# Patient Record
Sex: Male | Born: 1955 | Race: White | Hispanic: No | Marital: Married | Smoking: Never smoker
Health system: Southern US, Community
[De-identification: ages and names within clinical notes are randomized; demographics above are authoritative.]

## PROBLEM LIST (undated history)

## (undated) DIAGNOSIS — R972 Elevated prostate specific antigen [PSA]: Secondary | ICD-10-CM

## (undated) DIAGNOSIS — H269 Unspecified cataract: Secondary | ICD-10-CM

## (undated) DIAGNOSIS — C801 Malignant (primary) neoplasm, unspecified: Secondary | ICD-10-CM

## (undated) HISTORY — PX: COLONOSCOPY: SHX174

## (undated) HISTORY — PX: PROSTATE BIOPSY: SHX241

---

## 2006-07-13 ENCOUNTER — Ambulatory Visit (HOSPITAL_COMMUNITY): Admission: RE | Admit: 2006-07-13 | Discharge: 2006-07-13 | Payer: Self-pay | Admitting: *Deleted

## 2007-04-30 IMAGING — CR DG CHEST 2V
2 series · 2 of 2 positions shown · non-contrast
Comparison: none

HISTORY: Precatheterization, dyspnea, chest pressure

CHEST 2 VIEWS:
No prior exams for comparison.
Normal heart size, mediastinal contours, and vascularity.
Minimal bronchitic changes
No infiltrate, effusion, or pneumothorax.
Bones unremarkable.

[view not recorded (1 of 2)]
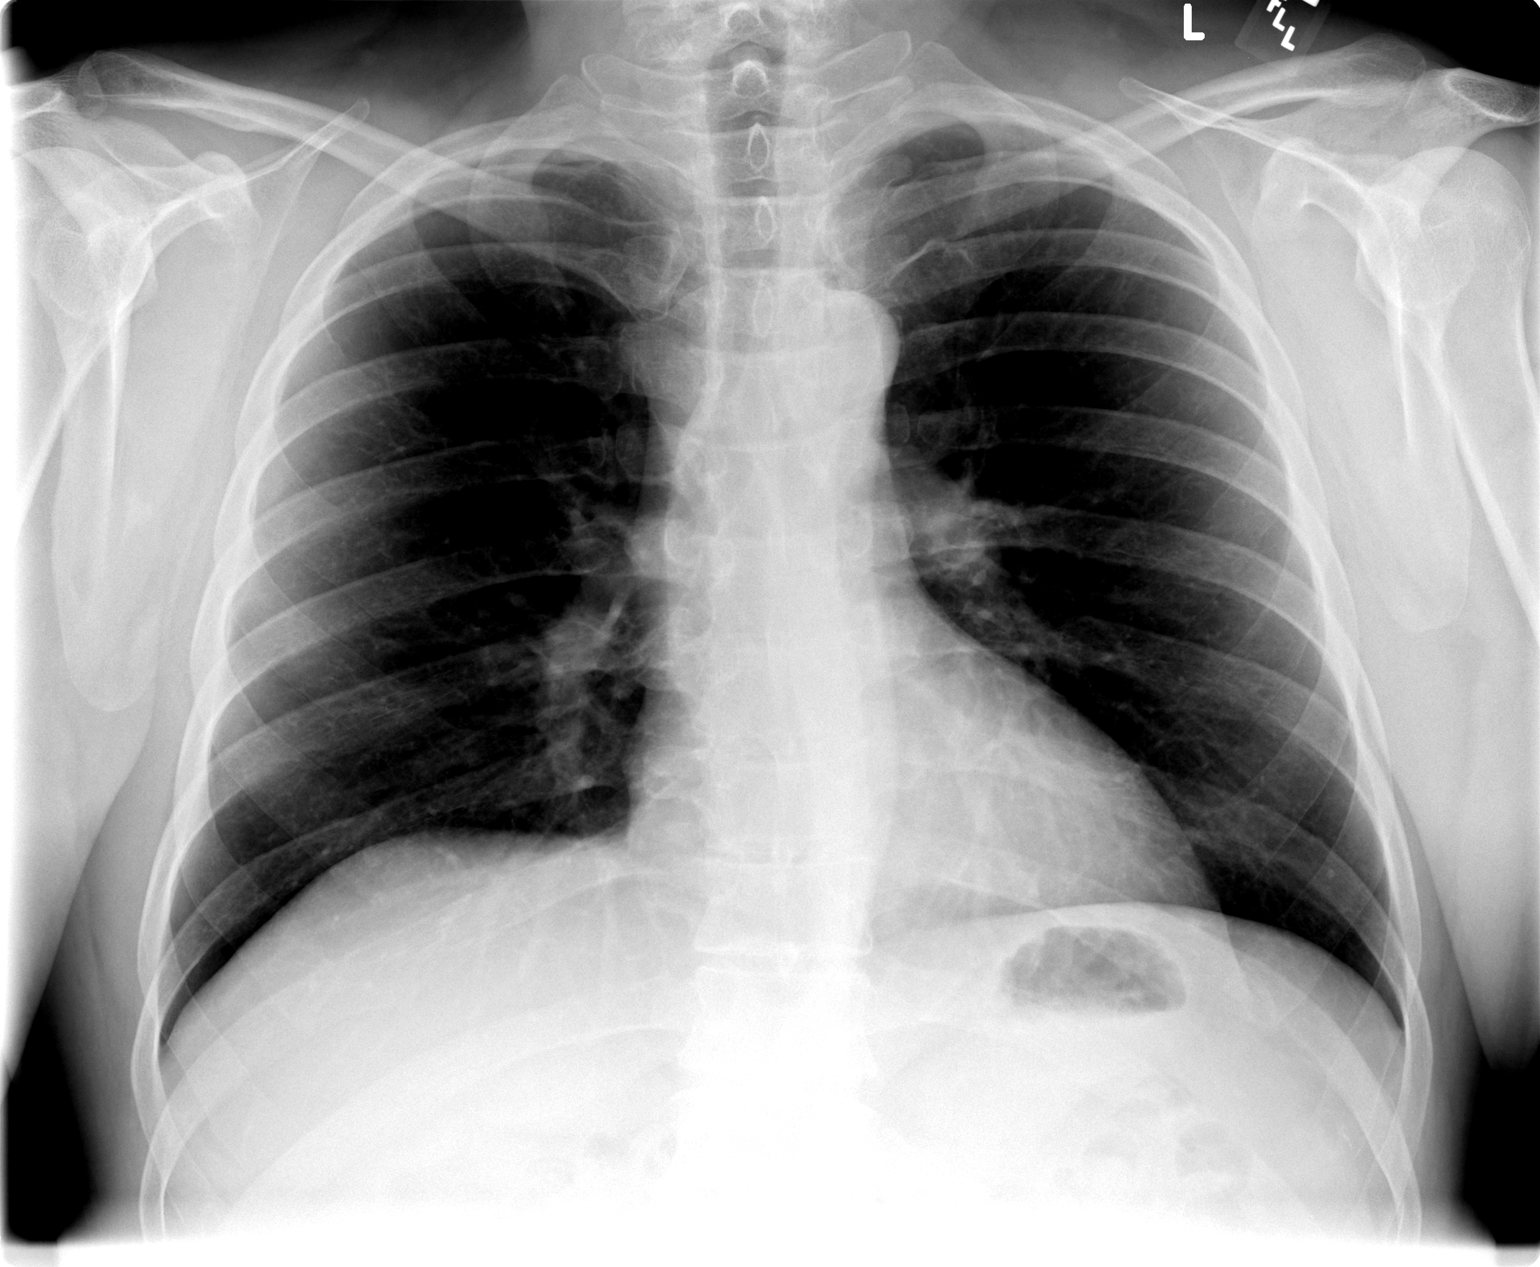

[view not recorded (2 of 2)]
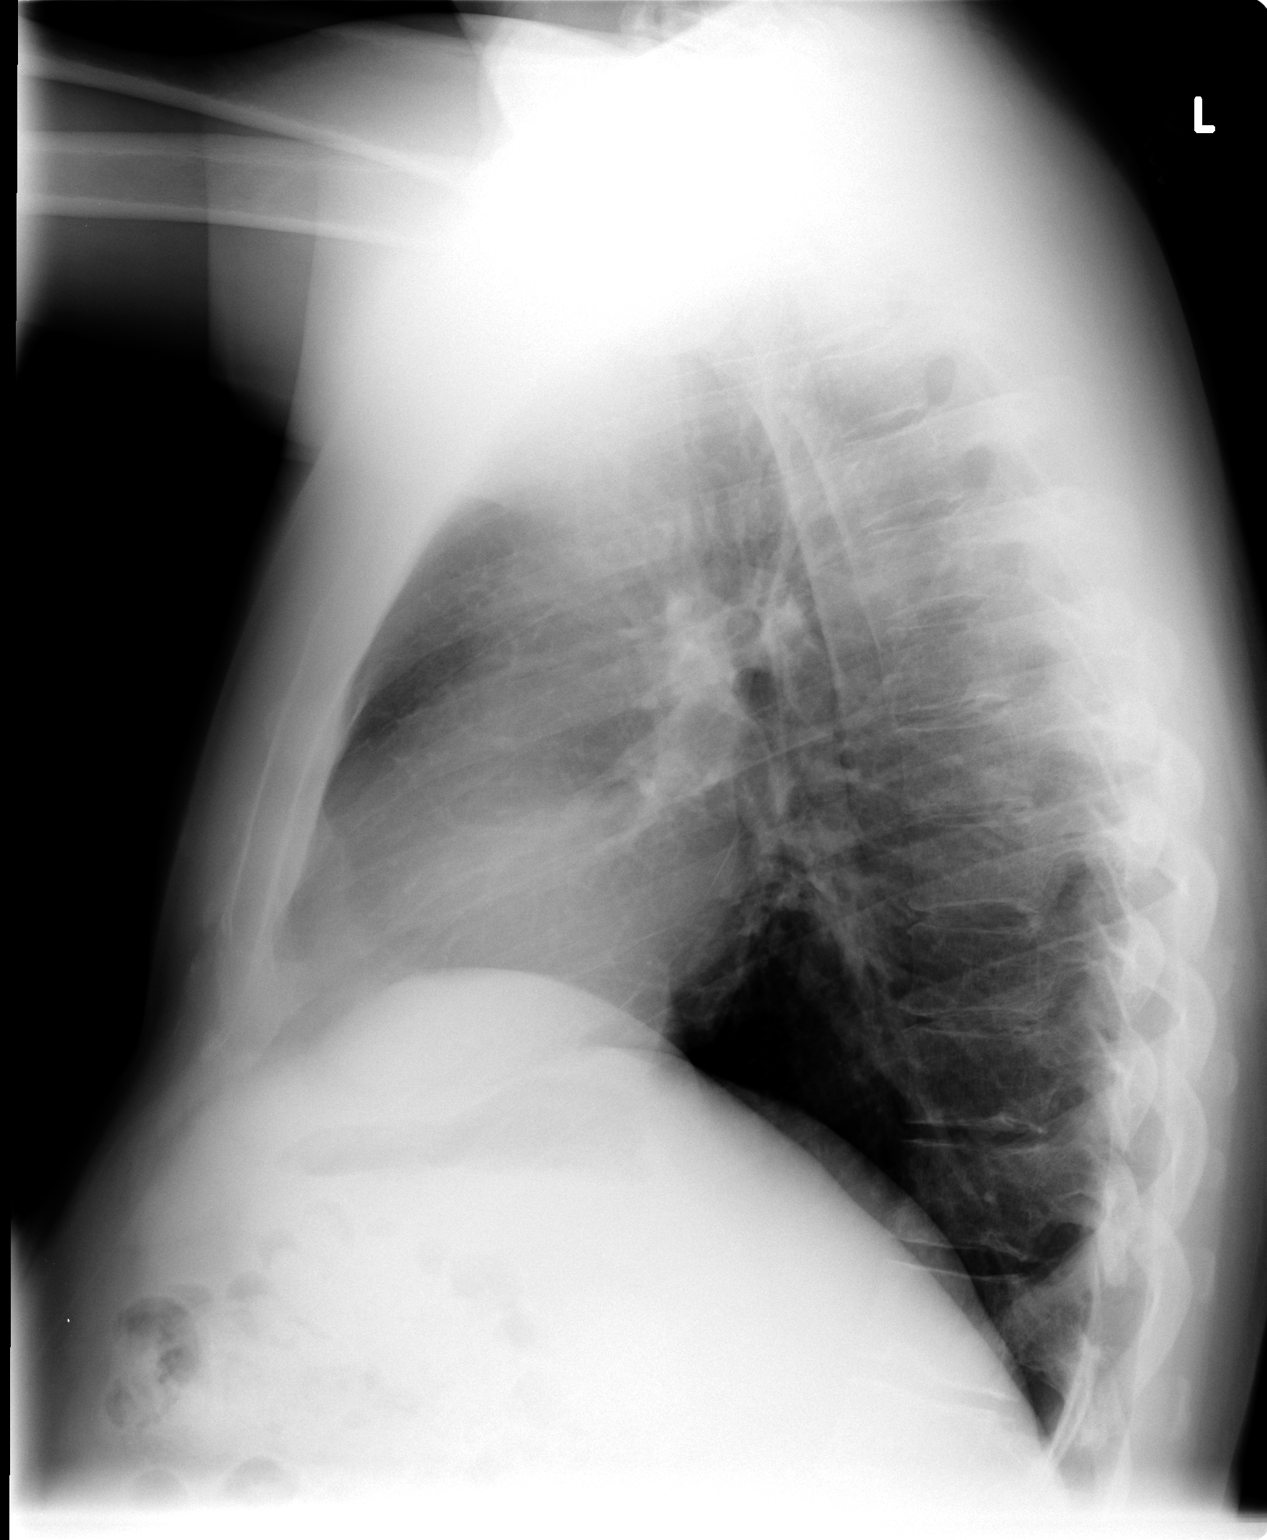

[2 of 2 positions shown; findings below may reference images not displayed]

IMPRESSION: Minimal bronchitic changes

## 2010-03-27 ENCOUNTER — Emergency Department (HOSPITAL_COMMUNITY): Admission: EM | Admit: 2010-03-27 | Discharge: 2010-03-27 | Payer: Self-pay | Admitting: Emergency Medicine

## 2016-01-28 DIAGNOSIS — Z6830 Body mass index (BMI) 30.0-30.9, adult: Secondary | ICD-10-CM | POA: Diagnosis not present

## 2016-01-28 DIAGNOSIS — Z1389 Encounter for screening for other disorder: Secondary | ICD-10-CM | POA: Diagnosis not present

## 2016-01-28 DIAGNOSIS — M7041 Prepatellar bursitis, right knee: Secondary | ICD-10-CM | POA: Diagnosis not present

## 2016-01-28 DIAGNOSIS — E6609 Other obesity due to excess calories: Secondary | ICD-10-CM | POA: Diagnosis not present

## 2016-04-21 DIAGNOSIS — E782 Mixed hyperlipidemia: Secondary | ICD-10-CM | POA: Diagnosis not present

## 2016-04-21 DIAGNOSIS — H109 Unspecified conjunctivitis: Secondary | ICD-10-CM | POA: Diagnosis not present

## 2016-04-21 DIAGNOSIS — I10 Essential (primary) hypertension: Secondary | ICD-10-CM | POA: Diagnosis not present

## 2016-04-21 DIAGNOSIS — Z681 Body mass index (BMI) 19 or less, adult: Secondary | ICD-10-CM | POA: Diagnosis not present

## 2016-05-18 ENCOUNTER — Ambulatory Visit (INDEPENDENT_AMBULATORY_CARE_PROVIDER_SITE_OTHER): Payer: BLUE CROSS/BLUE SHIELD

## 2016-05-18 ENCOUNTER — Encounter: Payer: Self-pay | Admitting: Orthopedic Surgery

## 2016-05-18 ENCOUNTER — Ambulatory Visit (INDEPENDENT_AMBULATORY_CARE_PROVIDER_SITE_OTHER): Payer: BLUE CROSS/BLUE SHIELD | Admitting: Orthopedic Surgery

## 2016-05-18 VITALS — BP 153/96 | HR 54 | Wt 205.0 lb

## 2016-05-18 DIAGNOSIS — M25561 Pain in right knee: Secondary | ICD-10-CM | POA: Diagnosis not present

## 2016-05-18 DIAGNOSIS — M1711 Unilateral primary osteoarthritis, right knee: Secondary | ICD-10-CM | POA: Diagnosis not present

## 2016-05-18 NOTE — Progress Notes (Signed)
Patient ID: Jose Hoover, male   DOB: 11/23/1955, 60 y.o.   MRN: [id number]  Chief Complaint  Patient presents with  . Knee Pain    right knee pain and swelling    HPI Jose OkWilliam P Hoover is a 60 y.o. male.  Presents for evaluation of his right knee. Primary problem is pain going up the steps. He did have a brief episode of pain where he had trouble walking. This was associated with swelling and difficulty bending his knee. At that time the pain was severe but is now improving. He has a history of prepatellar bursitis with 2 aspirations. That doesn't seem to be bothering him at this time  Review of Systems Review of Systems  Constitutional: Negative.   Respiratory: Negative.   Cardiovascular: Negative.    No prior history of surgery reported  History of hypertension  History of family history diabetes    Social History Social History  Substance Use Topics  . Smoking status: Current Every Day Smoker  . Smokeless tobacco: Never Used  . Alcohol use Not on file    No Known Allergies  No outpatient prescriptions have been marked as taking for the 05/18/16 encounter (Office Visit) with Vickki HearingStanley E Harrison, MD.      Physical Exam Physical Exam BP (!) 153/96   Pulse (!) 54   Wt 205 lb (93 kg)   Gen. appearance. The patient is well-developed and well-nourished, grooming and hygiene are normal. There are no gross congenital abnormalities  The patient is alert and oriented to person place and time  Mood and affect are normal  AmbulatioIs normal  Examination reveals the following: On inspection we finnormal range of motion alignment stability strength skin left knee  Right knee mild tenderness medial joint line  Full range of motion  Mild swollen prepatellar bursa sac  Ligaments stable Strength tests revealed grade 5 motor strength  Skin we find no rash ulceration or erythema  Sensation remains intact  Impression vascular system shows no peripheral  edema  Data Reviewed X-rays show mild arthritis lateral compartment more moderate arthritis medial compartment small joint effusion   Assessment    Encounter Diagnoses  Name Primary?  . Acute pain of right knee Yes  . Arthritis of knee, right        Planplan injected knee if symptoms return       Fuller CanadaStanley Harrison 05/18/2016, 2:42 PM

## 2017-08-02 HISTORY — PX: CATARACT EXTRACTION: SUR2

## 2017-10-31 DIAGNOSIS — R7309 Other abnormal glucose: Secondary | ICD-10-CM | POA: Diagnosis not present

## 2017-10-31 DIAGNOSIS — E663 Overweight: Secondary | ICD-10-CM | POA: Diagnosis not present

## 2017-10-31 DIAGNOSIS — Z1389 Encounter for screening for other disorder: Secondary | ICD-10-CM | POA: Diagnosis not present

## 2017-10-31 DIAGNOSIS — Z6829 Body mass index (BMI) 29.0-29.9, adult: Secondary | ICD-10-CM | POA: Diagnosis not present

## 2017-10-31 DIAGNOSIS — N529 Male erectile dysfunction, unspecified: Secondary | ICD-10-CM | POA: Diagnosis not present

## 2017-10-31 DIAGNOSIS — R7989 Other specified abnormal findings of blood chemistry: Secondary | ICD-10-CM | POA: Diagnosis not present

## 2017-10-31 DIAGNOSIS — N400 Enlarged prostate without lower urinary tract symptoms: Secondary | ICD-10-CM | POA: Diagnosis not present

## 2017-10-31 DIAGNOSIS — I10 Essential (primary) hypertension: Secondary | ICD-10-CM | POA: Diagnosis not present

## 2017-10-31 DIAGNOSIS — E782 Mixed hyperlipidemia: Secondary | ICD-10-CM | POA: Diagnosis not present

## 2018-01-02 DIAGNOSIS — N401 Enlarged prostate with lower urinary tract symptoms: Secondary | ICD-10-CM | POA: Diagnosis not present

## 2018-01-02 DIAGNOSIS — N138 Other obstructive and reflux uropathy: Secondary | ICD-10-CM | POA: Diagnosis not present

## 2018-01-02 DIAGNOSIS — R972 Elevated prostate specific antigen [PSA]: Secondary | ICD-10-CM | POA: Diagnosis not present

## 2018-01-02 DIAGNOSIS — N529 Male erectile dysfunction, unspecified: Secondary | ICD-10-CM | POA: Diagnosis not present

## 2018-01-23 DIAGNOSIS — R972 Elevated prostate specific antigen [PSA]: Secondary | ICD-10-CM | POA: Diagnosis not present

## 2018-03-08 DIAGNOSIS — H52203 Unspecified astigmatism, bilateral: Secondary | ICD-10-CM | POA: Diagnosis not present

## 2018-03-08 DIAGNOSIS — H25043 Posterior subcapsular polar age-related cataract, bilateral: Secondary | ICD-10-CM | POA: Diagnosis not present

## 2018-03-08 DIAGNOSIS — H524 Presbyopia: Secondary | ICD-10-CM | POA: Diagnosis not present

## 2018-03-08 DIAGNOSIS — H2513 Age-related nuclear cataract, bilateral: Secondary | ICD-10-CM | POA: Diagnosis not present

## 2018-03-08 DIAGNOSIS — H5203 Hypermetropia, bilateral: Secondary | ICD-10-CM | POA: Diagnosis not present

## 2018-03-22 DIAGNOSIS — H2513 Age-related nuclear cataract, bilateral: Secondary | ICD-10-CM | POA: Diagnosis not present

## 2018-03-22 DIAGNOSIS — H25043 Posterior subcapsular polar age-related cataract, bilateral: Secondary | ICD-10-CM | POA: Diagnosis not present

## 2018-03-29 DIAGNOSIS — H25042 Posterior subcapsular polar age-related cataract, left eye: Secondary | ICD-10-CM | POA: Diagnosis not present

## 2018-03-29 DIAGNOSIS — H25041 Posterior subcapsular polar age-related cataract, right eye: Secondary | ICD-10-CM | POA: Diagnosis not present

## 2018-03-29 DIAGNOSIS — H2512 Age-related nuclear cataract, left eye: Secondary | ICD-10-CM | POA: Diagnosis not present

## 2018-03-29 DIAGNOSIS — H2511 Age-related nuclear cataract, right eye: Secondary | ICD-10-CM | POA: Diagnosis not present

## 2018-04-05 DIAGNOSIS — H25042 Posterior subcapsular polar age-related cataract, left eye: Secondary | ICD-10-CM | POA: Diagnosis not present

## 2018-04-05 DIAGNOSIS — H2512 Age-related nuclear cataract, left eye: Secondary | ICD-10-CM | POA: Diagnosis not present

## 2022-09-22 ENCOUNTER — Other Ambulatory Visit: Payer: Self-pay

## 2022-09-22 ENCOUNTER — Encounter (HOSPITAL_COMMUNITY): Payer: Self-pay | Admitting: *Deleted

## 2022-09-22 ENCOUNTER — Emergency Department (HOSPITAL_COMMUNITY): Payer: BC Managed Care – PPO

## 2022-09-22 ENCOUNTER — Emergency Department (HOSPITAL_COMMUNITY)
Admission: EM | Admit: 2022-09-22 | Discharge: 2022-09-22 | Disposition: A | Discharge status: Home / Self Care | Payer: BC Managed Care – PPO | Attending: Emergency Medicine | Admitting: Emergency Medicine

## 2022-09-22 DIAGNOSIS — R42 Dizziness and giddiness: Secondary | ICD-10-CM | POA: Diagnosis not present

## 2022-09-22 DIAGNOSIS — I10 Essential (primary) hypertension: Secondary | ICD-10-CM | POA: Diagnosis not present

## 2022-09-22 DIAGNOSIS — R29818 Other symptoms and signs involving the nervous system: Secondary | ICD-10-CM | POA: Diagnosis not present

## 2022-09-22 DIAGNOSIS — I951 Orthostatic hypotension: Secondary | ICD-10-CM | POA: Diagnosis not present

## 2022-09-22 LAB — PROTIME-INR
INR: 1 (ref 0.8–1.2)
Prothrombin Time: 12.9 seconds (ref 11.4–15.2)

## 2022-09-22 LAB — CBC
HCT: 43 % (ref 39.0–52.0)
Hemoglobin: 13.5 g/dL (ref 13.0–17.0)
MCH: 27.2 pg (ref 26.0–34.0)
MCHC: 31.4 g/dL (ref 30.0–36.0)
MCV: 86.7 fL (ref 80.0–100.0)
Platelets: 172 10*3/uL (ref 150–400)
RBC: 4.96 MIL/uL (ref 4.22–5.81)
RDW: 13.3 % (ref 11.5–15.5)
WBC: 7.1 10*3/uL (ref 4.0–10.5)
nRBC: 0 % (ref 0.0–0.2)

## 2022-09-22 LAB — COMPREHENSIVE METABOLIC PANEL
ALT: 28 U/L (ref 0–44)
AST: 21 U/L (ref 15–41)
Albumin: 4.3 g/dL (ref 3.5–5.0)
Alkaline Phosphatase: 50 U/L (ref 38–126)
Anion gap: 8 (ref 5–15)
BUN: 20 mg/dL (ref 8–23)
CO2: 26 mmol/L (ref 22–32)
Calcium: 9 mg/dL (ref 8.9–10.3)
Chloride: 100 mmol/L (ref 98–111)
Creatinine, Ser: 0.83 mg/dL (ref 0.61–1.24)
GFR, Estimated: >60 mL/min (ref >60)
Glucose, Bld: 123 mg/dL — ABNORMAL HIGH (ref 70–99)
Potassium: 4.4 mmol/L (ref 3.5–5.1)
Sodium: 134 mmol/L — ABNORMAL LOW (ref 135–145)
Total Bilirubin: 0.7 mg/dL (ref 0.3–1.2)
Total Protein: 7.8 g/dL (ref 6.5–8.1)

## 2022-09-22 LAB — DIFFERENTIAL
Abs Immature Granulocytes: 0.04 10*3/uL (ref 0.00–0.07)
Basophils Absolute: 0 10*3/uL (ref 0.0–0.1)
Basophils Relative: 0 %
Eosinophils Absolute: 0.2 10*3/uL (ref 0.0–0.5)
Eosinophils Relative: 2 %
Immature Granulocytes: 1 %
Lymphocytes Relative: 10 %
Lymphs Abs: 0.7 10*3/uL (ref 0.7–4.0)
Monocytes Absolute: 0.5 10*3/uL (ref 0.1–1.0)
Monocytes Relative: 8 %
Neutro Abs: 5.6 10*3/uL (ref 1.7–7.7)
Neutrophils Relative %: 79 %

## 2022-09-22 LAB — URINALYSIS, ROUTINE W REFLEX MICROSCOPIC
Bacteria, UA: NONE SEEN
Bilirubin Urine: NEGATIVE
Glucose, UA: NEGATIVE mg/dL
Hgb urine dipstick: NEGATIVE
Ketones, ur: 5 mg/dL — AB
Leukocytes,Ua: NEGATIVE
Nitrite: NEGATIVE
Protein, ur: 30 mg/dL — AB
Specific Gravity, Urine: 1.024 (ref 1.005–1.030)
pH: 6 (ref 5.0–8.0)

## 2022-09-22 LAB — RAPID URINE DRUG SCREEN, HOSP PERFORMED
Amphetamines: NOT DETECTED
Barbiturates: NOT DETECTED
Benzodiazepines: NOT DETECTED
Cocaine: NOT DETECTED
Opiates: NOT DETECTED
Tetrahydrocannabinol: NOT DETECTED

## 2022-09-22 LAB — ETHANOL: Alcohol, Ethyl (B): <10 mg/dL (ref <10)

## 2022-09-22 LAB — APTT: aPTT: 24 seconds (ref 24–36)

## 2022-09-22 LAB — MAGNESIUM: Magnesium: 2 mg/dL (ref 1.7–2.4)

## 2022-09-22 NOTE — Discharge Instructions (Signed)
Your lab work and MRI are reassuring.  Continue to check your blood pressures at home.  If BPs remain elevated, talk to your primary care doctor about possibly starting on a medication for hypertension.  Return to the emergency department at anytime for any new or worsening symptoms of concern.

## 2022-09-22 NOTE — ED Triage Notes (Addendum)
Noted with elevated BP this afternoon.  Denies any pain. Denies any hx of HTN.  Pt checked his BP at home due feeling off balance at 1430 today.  Vision disturbance, states feels like he has thick glasses on and takes "his eyes a minute to catch up".  EDP in triage to assess pt.

## 2022-09-22 NOTE — ED Provider Notes (Signed)
Jose Hoover   CSN: [id number] Arrival date & time: 09/22/22  1806     History  Chief Complaint  Patient presents with   Hypertension   Dizziness    Jose Hoover is a 67 y.o. male.   Hypertension  Dizziness Patient presents for dizziness and visual disturbance.  Earlier today, he was in his normal state of health.  In the early afternoon, approximately 2 hours prior to arrival, he noticed an odd feeling with his vision.  He denies any loss of visual acuity.  He states that when he looks from side-to-side, it feels like his eyes take some time to catch up.  He also describes some mild dizziness.  He denies any areas of weakness or numbness.     Home Medications Prior to Admission medications   Not on File      Allergies    Patient has no known allergies.    Review of Systems   Review of Systems  Eyes:  Positive for visual disturbance.  Neurological:  Positive for dizziness.  All other systems reviewed and are negative.   Physical Exam Updated Vital Signs BP (!) 162/90   Pulse (!) 56   Temp 97.8 F (36.6 C)   Resp 15   Ht 5' 10"$  (1.778 m)   Wt 86.2 kg   SpO2 98%   BMI 27.26 kg/m  Physical Exam Vitals and nursing Hoover reviewed.  Constitutional:      General: He is not in acute distress.    Appearance: Normal appearance. He is well-developed. He is not ill-appearing, toxic-appearing or diaphoretic.  HENT:     Head: Normocephalic and atraumatic.     Right Ear: External ear normal.     Left Ear: External ear normal.     Nose: Nose normal.     Mouth/Throat:     Mouth: Mucous membranes are moist.  Eyes:     Extraocular Movements: Extraocular movements intact.     Conjunctiva/sclera: Conjunctivae normal.     Comments: Nystagmus is present  Cardiovascular:     Rate and Rhythm: Normal rate and regular rhythm.  Pulmonary:     Effort: Pulmonary effort is normal. No respiratory distress.   Abdominal:     General: There is no distension.     Palpations: Abdomen is soft.  Musculoskeletal:        General: No swelling. Normal range of motion.     Cervical back: Normal range of motion and neck supple.     Right lower leg: No edema.     Left lower leg: No edema.  Skin:    General: Skin is warm and dry.     Capillary Refill: Capillary refill takes less than 2 seconds.     Coloration: Skin is not jaundiced or pale.  Neurological:     Mental Status: He is alert.     Cranial Nerves: Cranial nerves 2-12 are intact. No dysarthria or facial asymmetry.     Sensory: Sensation is intact. No sensory deficit.     Motor: Motor function is intact. No weakness, abnormal muscle tone or pronator drift.     Coordination: Coordination is intact. Finger-Nose-Finger Test normal.  Psychiatric:        Mood and Affect: Mood normal.     ED Results / Procedures / Treatments   Labs (all labs ordered are listed, but only abnormal results are displayed) Labs Reviewed  COMPREHENSIVE METABOLIC PANEL - Abnormal; Notable  for the following components:      Result Value   Sodium 134 (*)    Glucose, Bld 123 (*)    All other components within normal limits  URINALYSIS, ROUTINE W REFLEX MICROSCOPIC - Abnormal; Notable for the following components:   Ketones, ur 5 (*)    Protein, ur 30 (*)    All other components within normal limits  PROTIME-INR  APTT  CBC  DIFFERENTIAL  ETHANOL  RAPID URINE DRUG SCREEN, HOSP PERFORMED  MAGNESIUM  I-STAT CHEM 8, ED    EKG None  Radiology MR BRAIN WO CONTRAST  Result Date: 09/22/2022 CLINICAL DATA:  Neuro deficit, acute, stroke suspected EXAM: MRI HEAD WITHOUT CONTRAST TECHNIQUE: Multiplanar, multiecho pulse sequences of the brain and surrounding structures were obtained without intravenous contrast. COMPARISON:  None Available. FINDINGS: Brain: Diffusion imaging does not show any acute or subacute infarction. Brainstem and cerebellum are normal. Cerebral  hemispheres are normal except for a few punctate foci of T2 and FLAIR signal in the white matter of the forceps major. No evidence of widespread small-vessel disease. No cortical or large vessel territory insult. No mass lesion, hemorrhage, hydrocephalus or extra-axial collection. Vascular: Major vessels at the base of the brain show flow. Skull and upper cervical spine: Negative Sinuses/Orbits: Clear/normal Other: None IMPRESSION: No acute or reversible finding. Normal study with exception of a few punctate foci of T2 and FLAIR signal in the white matter of the forceps major, often seen at 68. Electronically Signed   By: Nelson Chimes M.D.   On: 09/22/2022 19:51    Procedures Procedures    Medications Ordered in ED Medications - No data to display  ED Course/ Medical Decision Making/ A&P                             Medical Decision Making Amount and/or Complexity of Data Reviewed Labs: ordered. Radiology: ordered.   This patient presents to the ED for concern of dizziness, this involves an extensive number of treatment options, and is a complaint that carries with it a high risk of complications and morbidity.  The differential diagnosis includes CVA, TIA, dehydration, metabolic derangements, polypharmacy, anxiety, hypertensive urgency   Co morbidities that complicate the patient evaluation  BPH, elevated PSA   Additional history obtained:  Additional history obtained from N/A External records from outside source obtained and reviewed including EMR   Lab Tests:  I Ordered, and personally interpreted labs.  The pertinent results include: Normal kidney function, normal electrolytes, normal glucose, normal hemoglobin, no leukocytosis   Imaging Studies ordered:  I ordered imaging studies including MRI brain I independently visualized and interpreted imaging which showed no acute findings I agree with the radiologist interpretation   Cardiac Monitoring: / EKG:  The  patient was maintained on a cardiac monitor.  I personally viewed and interpreted the cardiac monitored which showed an underlying rhythm of: Sinus rhythm  Problem List / ED Course / Critical interventions / Medication management  Patient presents for acute onset of dizziness and visual disturbances.  This occurred 2 hours prior to arrival.  On arrival to the ED, vital signs are notable for hypertension.  Patient states that this is not normal for him.  On physical exam, patient is alert and oriented.  He has no focal neurologic deficits but does have what appears to be some vertical nystagmus.  This raises concern for central etiology.  MRI was ordered.  Patient  underwent MRI and lab work.  MRI did not show any acute findings.  Lab work was unremarkable.  On reassessment, patient reports improved symptoms.  Blood pressure remains elevated.  Patient reports that he does not take any blood pressure medications.  He will occasionally check his blood pressure at home and it is typically in the normal range.  Elevation of blood pressure not consistent with hypertensive emergency.  Patient was advised to continue to check his blood pressure at home and follow-up with his outpatient doctors if pressure readings remain high.  Given his reassuring workup and improved symptoms, patient is stable for discharge.  Social Determinants of Health:  Has access to outpatient care         Final Clinical Impression(s) / ED Diagnoses Final diagnoses:  Dizziness    Rx / DC Orders ED Discharge Orders     None         Godfrey Pick, MD 09/22/22 2037

## 2023-01-06 DIAGNOSIS — E291 Testicular hypofunction: Secondary | ICD-10-CM | POA: Diagnosis not present

## 2023-01-06 DIAGNOSIS — R972 Elevated prostate specific antigen [PSA]: Secondary | ICD-10-CM | POA: Diagnosis not present

## 2023-01-06 DIAGNOSIS — Z1329 Encounter for screening for other suspected endocrine disorder: Secondary | ICD-10-CM | POA: Diagnosis not present

## 2023-01-14 DIAGNOSIS — R7309 Other abnormal glucose: Secondary | ICD-10-CM | POA: Diagnosis not present

## 2023-01-14 DIAGNOSIS — M25511 Pain in right shoulder: Secondary | ICD-10-CM | POA: Diagnosis not present

## 2023-01-14 DIAGNOSIS — Z6830 Body mass index (BMI) 30.0-30.9, adult: Secondary | ICD-10-CM | POA: Diagnosis not present

## 2023-01-14 DIAGNOSIS — Z1331 Encounter for screening for depression: Secondary | ICD-10-CM | POA: Diagnosis not present

## 2023-01-14 DIAGNOSIS — E6609 Other obesity due to excess calories: Secondary | ICD-10-CM | POA: Diagnosis not present

## 2023-01-14 DIAGNOSIS — Z0001 Encounter for general adult medical examination with abnormal findings: Secondary | ICD-10-CM | POA: Diagnosis not present

## 2023-01-19 DIAGNOSIS — E782 Mixed hyperlipidemia: Secondary | ICD-10-CM | POA: Diagnosis not present

## 2023-01-19 DIAGNOSIS — Z125 Encounter for screening for malignant neoplasm of prostate: Secondary | ICD-10-CM | POA: Diagnosis not present

## 2023-01-19 DIAGNOSIS — R7309 Other abnormal glucose: Secondary | ICD-10-CM | POA: Diagnosis not present

## 2023-01-19 DIAGNOSIS — Z0001 Encounter for general adult medical examination with abnormal findings: Secondary | ICD-10-CM | POA: Diagnosis not present

## 2023-02-23 ENCOUNTER — Ambulatory Visit (INDEPENDENT_AMBULATORY_CARE_PROVIDER_SITE_OTHER): Payer: BC Managed Care – PPO | Admitting: Urology

## 2023-02-23 ENCOUNTER — Encounter: Payer: Self-pay | Admitting: Urology

## 2023-02-23 VITALS — BP 182/79 | HR 58

## 2023-02-23 DIAGNOSIS — R972 Elevated prostate specific antigen [PSA]: Secondary | ICD-10-CM

## 2023-02-23 LAB — URINALYSIS, ROUTINE W REFLEX MICROSCOPIC
Bilirubin, UA: NEGATIVE
Glucose, UA: NEGATIVE
Ketones, UA: NEGATIVE
Leukocytes,UA: NEGATIVE
Nitrite, UA: NEGATIVE
Protein,UA: NEGATIVE
RBC, UA: NEGATIVE
Specific Gravity, UA: <1.005 — ABNORMAL LOW (ref 1.005–1.030)
Urobilinogen, Ur: 0.2 mg/dL (ref 0.2–1.0)
pH, UA: 5.5 (ref 5.0–7.5)

## 2023-02-23 NOTE — Progress Notes (Signed)
02/23/2023 10:06 AM   Chrissie Noa Hostler 1955-09-14 [id number]  Referring provider: Janace Aris, NP [address] [place],  [place] [address]  Elevated PSA   HPI: Jose Hoover is a 67yo here for evaluation of elevated PSA. PSA 9.8 this year. No prior PSAs. No family hx of prostate cancer. IPSS 5 QOl 2 on no therapy. He was on TRT 7 years ago. He was being considered recently for TRT therapy and on screening the PSA was found to be elevated.    PMH: No past medical history on file.  Surgical History: No past surgical history on file.  Home Medications:  Allergies as of 02/23/2023   No Known Allergies      Medication List    as of February 23, 2023 10:06 AM   You have not been prescribed any medications.     Allergies: No Known Allergies  Family History: No family history on file.  Social History:  reports that he has never smoked. He has never used smokeless tobacco. He reports current alcohol use. He reports that he does not use drugs.  ROS: All other review of systems were reviewed and are negative except what is noted above in HPI  Physical Exam: There were no vitals taken for this visit.  Constitutional:  Alert and oriented, No acute distress. HEENT: [place] AT, moist mucus membranes.  Trachea midline, no masses. Cardiovascular: No clubbing, cyanosis, or edema. Respiratory: Normal respiratory effort, no increased work of breathing. GI: Abdomen is soft, nontender, nondistended, no abdominal masses GU: No CVA tenderness. Circumcised phallus. No masses/lesions on penis, testis, scrotum. Prostate 40g smooth no nodules no induration.  Lymph: No cervical or inguinal lymphadenopathy. Skin: No rashes, bruises or suspicious lesions. Neurologic: Grossly intact, no focal deficits, moving all 4 extremities. Psychiatric: Normal mood and affect.  Laboratory Data: Lab Results  Component Value Date   WBC 7.1 09/22/2022   HGB 13.5 09/22/2022   HCT 43.0 09/22/2022    MCV 86.7 09/22/2022   PLT 172 09/22/2022    Lab Results  Component Value Date   CREATININE 0.83 09/22/2022    No results found for: "PSA"  No results found for: "TESTOSTERONE"  No results found for: "HGBA1C"  Urinalysis    Component Value Date/Time   COLORURINE YELLOW 09/22/2022 1913   APPEARANCEUR CLEAR 09/22/2022 1913   LABSPEC 1.024 09/22/2022 1913   PHURINE 6.0 09/22/2022 1913   GLUCOSEU NEGATIVE 09/22/2022 1913   HGBUR NEGATIVE 09/22/2022 1913   BILIRUBINUR NEGATIVE 09/22/2022 1913   KETONESUR 5 (A) 09/22/2022 1913   PROTEINUR 30 (A) 09/22/2022 1913   NITRITE NEGATIVE 09/22/2022 1913   LEUKOCYTESUR NEGATIVE 09/22/2022 1913    Lab Results  Component Value Date   BACTERIA NONE SEEN 09/22/2022    Pertinent Imaging:  No results found for this or any previous visit.  No results found for this or any previous visit.  No results found for this or any previous visit.  No results found for this or any previous visit.  No results found for this or any previous visit.  No valid procedures specified. No results found for this or any previous visit.  No results found for this or any previous visit.   Assessment & Plan:    1. Elevated PSA We will recheck PSA. If his PSA remains elevated we will proceed with prostate biopsy - Urinalysis, Routine w reflex microscopic   No follow-ups on file.  Wilkie Aye, MD  [facility] [place]

## 2023-02-23 NOTE — Patient Instructions (Signed)

## 2023-03-01 ENCOUNTER — Telehealth: Payer: Self-pay

## 2023-03-01 DIAGNOSIS — R972 Elevated prostate specific antigen [PSA]: Secondary | ICD-10-CM

## 2023-03-01 MED ORDER — LEVOFLOXACIN 750 MG PO TABS: 750.0000 mg | ORAL_TABLET | Freq: Once | ORAL | 0 refills | Status: AC

## 2023-03-01 NOTE — Telephone Encounter (Signed)
Patient called and made aware. Appointments made, orders placed, and biopsy instructions went over with patient via phone and sent via mail.

## 2023-03-01 NOTE — Telephone Encounter (Signed)
-----   Message from Wilkie Aye sent at 03/01/2023 12:39 PM EDT ----- PSA remains high., He should proceed with biopsy ----- Message ----- From: Gustavus Messing, LPN Sent: 7/56/4332   2:25 PM EDT To: Malen Gauze, MD  Please review

## 2023-03-01 NOTE — Progress Notes (Signed)
Biopsy instructions letter

## 2023-04-11 ENCOUNTER — Encounter: Payer: Self-pay | Admitting: Urology

## 2023-04-11 ENCOUNTER — Encounter (HOSPITAL_COMMUNITY): Payer: Self-pay

## 2023-04-11 ENCOUNTER — Ambulatory Visit (HOSPITAL_COMMUNITY)
Admission: RE | Admit: 2023-04-11 | Discharge: 2023-04-11 | Disposition: A | Discharge status: Home / Self Care | Payer: BC Managed Care – PPO | Source: Ambulatory Visit | Attending: Urology | Admitting: Urology

## 2023-04-11 ENCOUNTER — Ambulatory Visit (HOSPITAL_BASED_OUTPATIENT_CLINIC_OR_DEPARTMENT_OTHER): Payer: BC Managed Care – PPO | Admitting: Urology

## 2023-04-11 ENCOUNTER — Other Ambulatory Visit: Payer: Self-pay | Admitting: Urology

## 2023-04-11 VITALS — BP 178/95 | HR 60 | Temp 98.1°F | Resp 18 | Ht 70.0 in | Wt 195.0 lb

## 2023-04-11 DIAGNOSIS — C61 Malignant neoplasm of prostate: Secondary | ICD-10-CM

## 2023-04-11 DIAGNOSIS — R972 Elevated prostate specific antigen [PSA]: Secondary | ICD-10-CM | POA: Diagnosis not present

## 2023-04-11 HISTORY — DX: Unspecified cataract: H26.9

## 2023-04-11 MED ORDER — LIDOCAINE HCL (PF) 2 % IJ SOLN
10.0000 mL | Freq: Once | INTRAMUSCULAR | Status: AC
  Administered 2023-04-11: 10 mL

## 2023-04-11 MED ORDER — GENTAMICIN SULFATE 40 MG/ML IJ SOLN
INTRAMUSCULAR | Status: AC
  Filled 2023-04-11: qty 2

## 2023-04-11 MED ORDER — LIDOCAINE HCL (PF) 2 % IJ SOLN
INTRAMUSCULAR | Status: AC
  Filled 2023-04-11: qty 10

## 2023-04-11 MED ORDER — GENTAMICIN SULFATE 40 MG/ML IJ SOLN
80.0000 mg | Freq: Once | INTRAMUSCULAR | Status: AC
  Administered 2023-04-11: 80 mg via INTRAMUSCULAR

## 2023-04-11 NOTE — Progress Notes (Signed)
PT tolerated prostate biopsy procedure and antibiotic injection well today. Labs obtained and sent for pathology by Richard from ultrasound. PT ambulatory at discharge with no acute distress noted and verbalized understanding of discharge instructions. PT to follow up with urologist as scheduled on 04/29/23 @ 12:50.

## 2023-04-11 NOTE — Progress Notes (Signed)
Prostate Biopsy Procedure   Informed consent was obtained after discussing risks/benefits of the procedure.  A time out was performed to ensure correct patient identity.  Pre-Procedure: - Last PSA Level: No results found for: "PSA" - Gentamicin given prophylactically - Levaquin 500 mg administered PO -Transrectal Ultrasound performed revealing a 37.2 gm prostate -No significant hypoechoic or median lobe noted  Procedure: - Prostate block performed using 10 cc 1% lidocaine and biopsies taken from sextant areas, a total of 12 under ultrasound guidance.  Post-Procedure: - Patient tolerated the procedure well - He was counseled to seek immediate medical attention if experiences any severe pain, significant bleeding, or fevers - Return in one week to discuss biopsy results

## 2023-04-20 ENCOUNTER — Encounter: Payer: Self-pay | Admitting: Urology

## 2023-04-20 ENCOUNTER — Ambulatory Visit (INDEPENDENT_AMBULATORY_CARE_PROVIDER_SITE_OTHER): Payer: BC Managed Care – PPO | Admitting: Urology

## 2023-04-20 VITALS — BP 146/87 | HR 70

## 2023-04-20 DIAGNOSIS — C61 Malignant neoplasm of prostate: Secondary | ICD-10-CM | POA: Diagnosis not present

## 2023-04-20 NOTE — Patient Instructions (Signed)

## 2023-04-20 NOTE — Progress Notes (Signed)
04/20/2023 3:52 PM   Jose Hoover 07/22/1956 [id number]  Referring provider: Assunta Found, MD [address] [place],  [place] [address]  Followup prostate biopsy   HPI: [name] is a 67yo here for followup after prostate biopsy. Biopsy revealed Gleason 3+3=6 in 6/12 cores all on the right side. PSA 8.7. Prostate volume 37cc.    PMH: Past Medical History:  Diagnosis Date   Cataract     Surgical History: Past Surgical History:  Procedure Laterality Date   CATARACT EXTRACTION  2019    Home Medications:  Allergies as of 04/20/2023       Reactions   Bee Venom    Other Swelling   BEES: Hives, swelling,  Difficulty breathing        Medication List    as of April 20, 2023  3:52 PM   You have not been prescribed any medications.     Allergies:  Allergies  Allergen Reactions   Bee Venom    Other Swelling    BEES: Hives, swelling,  Difficulty breathing    Family History: No family history on file.  Social History:  reports that he has never smoked. He has never used smokeless tobacco. He reports current alcohol use. He reports that he does not use drugs.  ROS: All other review of systems were reviewed and are negative except what is noted above in HPI  Physical Exam: BP (!) 146/87   Pulse 70   Constitutional:  Alert and oriented, No acute distress. HEENT: [place] AT, moist mucus membranes.  Trachea midline, no masses. Cardiovascular: No clubbing, cyanosis, or edema. Respiratory: Normal respiratory effort, no increased work of breathing. GI: Abdomen is soft, nontender, nondistended, no abdominal masses GU: No CVA tenderness.  Lymph: No cervical or inguinal lymphadenopathy. Skin: No rashes, bruises or suspicious lesions. Neurologic: Grossly intact, no focal deficits, moving all 4 extremities. Psychiatric: Normal mood and affect.  Laboratory Data: Lab Results  Component Value Date   WBC 7.1 09/22/2022   HGB 13.5 09/22/2022   HCT 43.0  09/22/2022   MCV 86.7 09/22/2022   PLT 172 09/22/2022    Lab Results  Component Value Date   CREATININE 0.83 09/22/2022    No results found for: "PSA"  No results found for: "TESTOSTERONE"  No results found for: "HGBA1C"  Urinalysis    Component Value Date/Time   COLORURINE YELLOW 09/22/2022 1913   APPEARANCEUR Clear 02/23/2023 1022   LABSPEC 1.024 09/22/2022 1913   PHURINE 6.0 09/22/2022 1913   GLUCOSEU Negative 02/23/2023 1022   HGBUR NEGATIVE 09/22/2022 1913   BILIRUBINUR Negative 02/23/2023 1022   KETONESUR 5 (A) 09/22/2022 1913   PROTEINUR Negative 02/23/2023 1022   PROTEINUR 30 (A) 09/22/2022 1913   NITRITE Negative 02/23/2023 1022   NITRITE NEGATIVE 09/22/2022 1913   LEUKOCYTESUR Negative 02/23/2023 1022   LEUKOCYTESUR NEGATIVE 09/22/2022 1913    Lab Results  Component Value Date   LABMICR Comment 02/23/2023   BACTERIA NONE SEEN 09/22/2022    Pertinent Imaging:  No results found for this or any previous visit.  No results found for this or any previous visit.  No results found for this or any previous visit.  No results found for this or any previous visit.  No results found for this or any previous visit.  No valid procedures specified. No results found for this or any previous visit.  No results found for this or any previous visit.   Assessment & Plan:    1. Prostate  cancer [facility]) I discussed the natural history of low risk prostate cancer with the patient and the various treatment options including active surveillance, RALP, IMRT, brachytherapy, cryotherapy, HIFU and ADT. After discussing the options the patient is undecided. I will refer him to Radiation Oncology for discussion of radiation therapy and Dr. Berneice Heinrich for consideration of RALP    No follow-ups on file.  Wilkie Aye, MD  [facility] [place]

## 2023-04-29 ENCOUNTER — Ambulatory Visit: Payer: BC Managed Care – PPO | Admitting: Urology

## 2023-05-02 ENCOUNTER — Encounter: Payer: Self-pay | Admitting: Radiation Oncology

## 2023-05-02 NOTE — Progress Notes (Signed)
GU Location of Tumor / Histology: Prostate Ca  If Prostate Cancer, Gleason Score is (3 + 3) and PSA is (8.7 on 02/23/2023)  PSA 9.8 on 01/17/2023  Biopsies      Past/Anticipated interventions by urology, if any: No  04/20/2023 Dr. Wilkie Aye    Past/Anticipated interventions by medical oncology, if any: NA  Weight changes, if any: {:18581}  IPSS: SHIM:  Bowel/Bladder complaints, if any: {:18581}   Nausea/Vomiting, if any: {:18581}  Pain issues, if any:  {:18581}  SAFETY ISSUES: Prior radiation? {:18581} Pacemaker/ICD? {:18581} Possible current pregnancy? Male Is the patient on methotrexate? No  Current Complaints / other details:

## 2023-05-04 DIAGNOSIS — C61 Malignant neoplasm of prostate: Secondary | ICD-10-CM | POA: Insufficient documentation

## 2023-05-04 NOTE — Progress Notes (Signed)
Radiation Oncology         [phone] ________________________________  Initial Outpatient Consultation  Name: Jose Hoover MRN: [id number]  Date: 05/05/2023  DOB: May 17, 1956  MW:[id number], Jonny Ruiz, MD  McKenzie, Mardene Celeste, MD   REFERRING PHYSICIAN: Malen Gauze, MD  DIAGNOSIS: 67 y.o. gentleman with Stage T1c adenocarcinoma of the prostate with Gleason score of 3+3, and PSA of 8.7.    ICD-10-CM   1. Malignant neoplasm of prostate (HCC)  C61       HISTORY OF PRESENT ILLNESS: Jose Hoover is a 67 y.o. male with a diagnosis of prostate cancer. He has a history of elevated PSA and has seen Dr. Darvin Neighbours in 2019 with a PSA of 5.2.  He was treated for prostatitis at that time and a follow-up PSA after completion of antibiotics was 4.21 so the recommendation was to have repeat labs in 6 months but he did not follow-up with urology thereafter.  More recently, he was noted to have an elevated PSA of 9.9 on routine labs by his primary care physician, Dr. Phillips Odor.  Accordingly, he was referred for evaluation in urology by Dr. Ronne Binning on 02/23/2023,  digital rectal examination performed at that time showed no discrete nodules or concerning findings.  A repeat PSA that same day remained elevated at 8.7 so, the patient proceeded to transrectal ultrasound with 12 biopsies of the prostate on 04/11/2023.  The prostate volume measured 37.2 cc.  Out of 12 core biopsies, 6 were positive, all on the right side.  The maximum Gleason score was 3+3, and this was seen in all 6 cores from the right lobe of the prostate.  The patient reviewed the biopsy results with his urologist and he has kindly been referred today for discussion of potential radiation treatment options.  He is also scheduled for a surgical consult with Dr. Berneice Heinrich on 06/06/2023.   PREVIOUS RADIATION THERAPY: No  PAST MEDICAL HISTORY:  Past Medical History:  Diagnosis Date   Cataract    Elevated PSA       PAST SURGICAL  HISTORY: Past Surgical History:  Procedure Laterality Date   CATARACT EXTRACTION  2019   PROSTATE BIOPSY      FAMILY HISTORY: No family history on file.  SOCIAL HISTORY:  Social History   Socioeconomic History   Marital status: Married    Spouse name: Not on file   Number of children: Not on file   Years of education: Not on file   Highest education level: Not on file  Occupational History   Not on file  Tobacco Use   Smoking status: Never   Smokeless tobacco: Never  Vaping Use   Vaping status: Never Used  Substance and Sexual Activity   Alcohol use: Yes    Comment: 6 pack/week   Drug use: Never   Sexual activity: Not on file  Other Topics Concern   Not on file  Social History Narrative   Not on file   Social Determinants of Health   Financial Resource Strain: Not on file  Food Insecurity: Not on file  Transportation Needs: Not on file  Physical Activity: Not on file  Stress: Not on file  Social Connections: Not on file  Intimate Partner Violence: Not on file    ALLERGIES: Bee venom and Other  MEDICATIONS:  Current Outpatient Medications  Medication Sig Dispense Refill   atorvastatin (LIPITOR) 10 MG tablet Take 10 mg by mouth daily.     No current facility-administered medications  for this visit.    REVIEW OF SYSTEMS:  On review of systems, the patient reports that he is doing well overall. He denies any chest pain, shortness of breath, cough, fevers, chills, night sweats, unintended weight changes. He denies any bowel disturbances, and denies abdominal pain, nausea or vomiting. He denies any new musculoskeletal or joint aches or pains. His IPSS was ***, indicating *** urinary symptoms. His SHIM was ***, indicating he {does not have/has mild/moderate/severe} erectile dysfunction. A complete review of systems is obtained and is otherwise negative.    PHYSICAL EXAM:  Wt Readings from Last 3 Encounters:  04/11/23 195 lb (88.5 kg)  09/22/22 190 lb (86.2 kg)   05/18/16 205 lb (93 kg)   Temp Readings from Last 3 Encounters:  04/11/23 98.1 F (36.7 C) (Oral)  09/22/22 97.8 F (36.6 C)   BP Readings from Last 3 Encounters:  04/20/23 (!) 146/87  04/11/23 (!) 178/95  02/23/23 (!) 182/79   Pulse Readings from Last 3 Encounters:  04/20/23 70  04/11/23 60  02/23/23 (!) 58    /10  In general this is a well appearing *** male in no acute distress. He's alert and oriented x4 and appropriate throughout the examination. Cardiopulmonary assessment is negative for acute distress, and he exhibits normal effort.     KPS = ***  100 - Normal; no complaints; no evidence of disease. 90   - Able to carry on normal activity; minor signs or symptoms of disease. 80   - Normal activity with effort; some signs or symptoms of disease. 27   - Cares for self; unable to carry on normal activity or to do active work. 60   - Requires occasional assistance, but is able to care for most of his personal needs. 50   - Requires considerable assistance and frequent medical care. 40   - Disabled; requires special care and assistance. 30   - Severely disabled; hospital admission is indicated although death not imminent. 20   - Very sick; hospital admission necessary; active supportive treatment necessary. 10   - Moribund; fatal processes progressing rapidly. 0     - Dead  Karnofsky DA, Abelmann WH, Craver LS and Burchenal JH [phone]) The use of the nitrogen mustards in the palliative treatment of carcinoma: with particular reference to bronchogenic carcinoma Cancer 1 634-56  LABORATORY DATA:  Lab Results  Component Value Date   WBC 7.1 09/22/2022   HGB 13.5 09/22/2022   HCT 43.0 09/22/2022   MCV 86.7 09/22/2022   PLT 172 09/22/2022   Lab Results  Component Value Date   NA 134 (L) 09/22/2022   K 4.4 09/22/2022   CL 100 09/22/2022   CO2 26 09/22/2022   Lab Results  Component Value Date   ALT 28 09/22/2022   AST 21 09/22/2022   ALKPHOS 50 09/22/2022    BILITOT 0.7 09/22/2022     RADIOGRAPHY: [place] Transrectal Complete  Result Date: 04/11/2023 Please see Notes tab for imaging impression.  [place] PROSTATE BIOPSY MULTIPLE  Result Date: 04/11/2023 Please see Notes tab for imaging impression.  US Guided Needle Placement  Result Date: 04/11/2023 CLINICAL DATA:  Ultrasound was provided for use by the ordering physician.  No provider Interpretation or professional fees incurred.       IMPRESSION/PLAN: 1. 67 y.o. gentleman with Stage T1c adenocarcinoma of the prostate with Gleason Score of 3+3, and PSA of 8.7. We discussed the patient's workup and outlined the nature of prostate cancer in this setting.  The patient's T stage, Gleason's score, and PSA put him into the low risk group. Accordingly, he is eligible for a variety of potential treatment options including  brachytherapy, 5.5 weeks of external radiation, or prostatectomy. We discussed the available radiation techniques, and focused on the details and logistics of delivery.  We discussed and outlined the risks, benefits, short and long-term effects associated with radiotherapy and compared and contrasted these with prostatectomy. We discussed the role of SpaceOAR gel in reducing the rectal toxicity associated with radiotherapy. He appears to have a good understanding of his disease and our treatment recommendations which are of curative intent.  He was encouraged to ask questions that were answered to his stated satisfaction.  At the conclusion of our conversation, the patient is interested in moving forward with ***.  We personally spent *** minutes in this encounter including chart review, reviewing radiological studies, meeting face-to-face with the patient, entering orders and completing documentation.    Marguarite Arbour, PA-C    Margaretmary Dys, MD  [facility]  Radiation Oncology Direct Dial: [phone]  Fax: [phone] [email or web address]  Skype  LinkedIn

## 2023-05-05 ENCOUNTER — Encounter: Payer: Self-pay | Admitting: Radiation Oncology

## 2023-05-05 ENCOUNTER — Ambulatory Visit
Admission: RE | Admit: 2023-05-05 | Discharge: 2023-05-05 | Disposition: A | Discharge status: Home / Self Care | Payer: BC Managed Care – PPO | Source: Ambulatory Visit | Attending: Radiation Oncology | Admitting: Radiation Oncology

## 2023-05-05 ENCOUNTER — Ambulatory Visit
Admission: RE | Admit: 2023-05-05 | Discharge: 2023-05-05 | Discharge status: Home / Self Care | Payer: BC Managed Care – PPO | Source: Ambulatory Visit | Attending: Radiation Oncology | Admitting: Radiation Oncology

## 2023-05-05 VITALS — BP 155/89 | HR 53 | Temp 97.3°F | Resp 18 | Ht 70.0 in | Wt 200.4 lb

## 2023-05-05 DIAGNOSIS — Z79899 Other long term (current) drug therapy: Secondary | ICD-10-CM | POA: Diagnosis not present

## 2023-05-05 DIAGNOSIS — C61 Malignant neoplasm of prostate: Secondary | ICD-10-CM | POA: Diagnosis not present

## 2023-05-05 DIAGNOSIS — Z191 Hormone sensitive malignancy status: Secondary | ICD-10-CM | POA: Diagnosis not present

## 2023-05-05 HISTORY — DX: Elevated prostate specific antigen (PSA): R97.20

## 2023-05-12 NOTE — Progress Notes (Signed)
Per Dr. Langston Masker' office, patient declined scheduling consult at this time due to wanting to pursue active surveillance.  Patient is scheduled for urology follow up on 10/30.  No additional needs at this time.

## 2023-05-27 ENCOUNTER — Other Ambulatory Visit: Payer: BC Managed Care – PPO

## 2023-05-27 DIAGNOSIS — C61 Malignant neoplasm of prostate: Secondary | ICD-10-CM

## 2023-05-28 LAB — PSA, TOTAL AND FREE
PSA, Free Pct: 9.8 %
PSA, Free: 1 ng/mL
Prostate Specific Ag, Serum: 10.2 ng/mL — ABNORMAL HIGH (ref 0.0–4.0)

## 2023-06-01 ENCOUNTER — Ambulatory Visit: Payer: BC Managed Care – PPO | Admitting: Urology

## 2023-06-03 ENCOUNTER — Encounter: Payer: Self-pay | Admitting: Urology

## 2023-06-03 ENCOUNTER — Ambulatory Visit (INDEPENDENT_AMBULATORY_CARE_PROVIDER_SITE_OTHER): Payer: BC Managed Care – PPO | Admitting: Urology

## 2023-06-03 VITALS — BP 146/86 | HR 60

## 2023-06-03 DIAGNOSIS — C61 Malignant neoplasm of prostate: Secondary | ICD-10-CM

## 2023-06-03 LAB — URINALYSIS, ROUTINE W REFLEX MICROSCOPIC
Bilirubin, UA: NEGATIVE
Glucose, UA: NEGATIVE
Ketones, UA: NEGATIVE
Leukocytes,UA: NEGATIVE
Nitrite, UA: NEGATIVE
Protein,UA: NEGATIVE
RBC, UA: NEGATIVE
Specific Gravity, UA: 1.03 (ref 1.005–1.030)
Urobilinogen, Ur: 0.2 mg/dL (ref 0.2–1.0)
pH, UA: 6 (ref 5.0–7.5)

## 2023-06-03 NOTE — Patient Instructions (Signed)

## 2023-06-03 NOTE — Progress Notes (Unsigned)
06/03/2023 9:30 AM   Jose Hoover 07/20/1956 [id number]  Referring provider: Assunta Found, MD [address] [place],  [place] [address]  Followup prostate cancer   HPI: Jose Hoover is a 67yo here for followup for prostate cancer. PSA increased to 10.2 from 8.7. He has Gleason 3+3=6 in 6/12 cores. He denies nay worsening LUTS. NO bone pain   PMH: Past Medical History:  Diagnosis Date   Cataract    Elevated PSA     Surgical History: Past Surgical History:  Procedure Laterality Date   CATARACT EXTRACTION  2019   PROSTATE BIOPSY      Home Medications:  Allergies as of 06/03/2023       Reactions   Bee Venom    Other Swelling   BEES: Hives, swelling,  Difficulty breathing        Medication List        Accurate as of June 03, 2023  9:30 AM. If you have any questions, ask your nurse or doctor.          atorvastatin 10 MG tablet Commonly known as: LIPITOR Take 10 mg by mouth daily.        Allergies:  Allergies  Allergen Reactions   Bee Venom    Other Swelling    BEES: Hives, swelling,  Difficulty breathing    Family History: No family history on file.  Social History:  reports that he has never smoked. He has never used smokeless tobacco. He reports current alcohol use. He reports that he does not use drugs.  ROS: All other review of systems were reviewed and are negative except what is noted above in HPI  Physical Exam: BP (!) 146/86   Pulse 60   Constitutional:  Alert and oriented, No acute distress. HEENT: [place] AT, moist mucus membranes.  Trachea midline, no masses. Cardiovascular: No clubbing, cyanosis, or edema. Respiratory: Normal respiratory effort, no increased work of breathing. GI: Abdomen is soft, nontender, nondistended, no abdominal masses GU: No CVA tenderness.  Lymph: No cervical or inguinal lymphadenopathy. Skin: No rashes, bruises or suspicious lesions. Neurologic: Grossly intact, no focal deficits, moving  all 4 extremities. Psychiatric: Normal mood and affect.  Laboratory Data: Lab Results  Component Value Date   WBC 7.1 09/22/2022   HGB 13.5 09/22/2022   HCT 43.0 09/22/2022   MCV 86.7 09/22/2022   PLT 172 09/22/2022    Lab Results  Component Value Date   CREATININE 0.83 09/22/2022    No results found for: "PSA"  No results found for: "TESTOSTERONE"  No results found for: "HGBA1C"  Urinalysis    Component Value Date/Time   COLORURINE YELLOW 09/22/2022 1913   APPEARANCEUR Clear 02/23/2023 1022   LABSPEC 1.024 09/22/2022 1913   PHURINE 6.0 09/22/2022 1913   GLUCOSEU Negative 02/23/2023 1022   HGBUR NEGATIVE 09/22/2022 1913   BILIRUBINUR Negative 02/23/2023 1022   KETONESUR 5 (A) 09/22/2022 1913   PROTEINUR Negative 02/23/2023 1022   PROTEINUR 30 (A) 09/22/2022 1913   NITRITE Negative 02/23/2023 1022   NITRITE NEGATIVE 09/22/2022 1913   LEUKOCYTESUR Negative 02/23/2023 1022   LEUKOCYTESUR NEGATIVE 09/22/2022 1913    Lab Results  Component Value Date   LABMICR Comment 02/23/2023   BACTERIA NONE SEEN 09/22/2022    Pertinent Imaging:  No results found for this or any previous visit.  No results found for this or any previous visit.  No results found for this or any previous visit.  No results found for this or any  previous visit.  No results found for this or any previous visit.  No valid procedures specified. No results found for this or any previous visit.  No results found for this or any previous visit.   Assessment & Plan:    1. Prostate cancer [facility]) MRI prostate  Followup 4-6 weeks  - Urinalysis, Routine w reflex microscopic   No follow-ups on file.  Wilkie Aye, MD  [facility] Urology [place]

## 2023-06-07 ENCOUNTER — Telehealth: Payer: Self-pay

## 2023-06-07 ENCOUNTER — Other Ambulatory Visit: Payer: Self-pay | Admitting: Urology

## 2023-06-07 DIAGNOSIS — C61 Malignant neoplasm of prostate: Secondary | ICD-10-CM

## 2023-06-07 DIAGNOSIS — F419 Anxiety disorder, unspecified: Secondary | ICD-10-CM

## 2023-06-07 MED ORDER — DIAZEPAM 2 MG PO TABS
ORAL_TABLET | ORAL | 0 refills | Status: AC
Start: 2023-06-07 — End: ?

## 2023-06-07 NOTE — Telephone Encounter (Signed)
Patient left a voice message to have vallum called in for him.  He is having a MRI on Wednesday. Wanting to pick up today for tomorrow's MRI.  Please advise.

## 2023-06-07 NOTE — Telephone Encounter (Signed)
Can you please send in for Dr. Ronne Binning. Thank you!

## 2023-06-08 ENCOUNTER — Ambulatory Visit (HOSPITAL_COMMUNITY)
Admission: RE | Admit: 2023-06-08 | Discharge: 2023-06-08 | Disposition: A | Discharge status: Home / Self Care | Payer: BC Managed Care – PPO | Source: Ambulatory Visit | Attending: Urology | Admitting: Urology

## 2023-06-08 DIAGNOSIS — C61 Malignant neoplasm of prostate: Secondary | ICD-10-CM | POA: Insufficient documentation

## 2023-06-08 DIAGNOSIS — K5730 Diverticulosis of large intestine without perforation or abscess without bleeding: Secondary | ICD-10-CM | POA: Diagnosis not present

## 2023-06-08 MED ORDER — GADOBUTROL 1 MMOL/ML IV SOLN
9.0000 mL | Freq: Once | INTRAVENOUS | Status: AC | PRN
  Administered 2023-06-08: 9 mL via INTRAVENOUS

## 2023-06-29 ENCOUNTER — Ambulatory Visit (INDEPENDENT_AMBULATORY_CARE_PROVIDER_SITE_OTHER): Payer: BC Managed Care – PPO | Admitting: Urology

## 2023-06-29 VITALS — BP 171/95 | HR 71

## 2023-06-29 DIAGNOSIS — C61 Malignant neoplasm of prostate: Secondary | ICD-10-CM | POA: Diagnosis not present

## 2023-06-29 NOTE — Progress Notes (Signed)
06/29/2023 2:42 PM   Jose Hoover [birth year] [id number]  Referring provider: Assunta Found, MD [address] [place],  [place] [address]  Followup prostate cancer   HPI: [name] is a 67yo here for followup for prostate cancer. MRi shows PIRADs 5 lesion involving right transition zone. He has 6/12 cores positive for Gleason 3+3=6 prostate cancer. PSA 10.    PMH: Past Medical History:  Diagnosis Date   Cataract    Elevated PSA     Surgical History: Past Surgical History:  Procedure Laterality Date   CATARACT EXTRACTION  2019   PROSTATE BIOPSY      Home Medications:  Allergies as of 06/29/2023       Reactions   Bee Venom    Other Swelling   BEES: Hives, swelling,  Difficulty breathing        Medication List        Accurate as of June 29, 2023  2:42 PM. If you have any questions, ask your nurse or doctor.          atorvastatin 10 MG tablet Commonly known as: LIPITOR Take 10 mg by mouth daily.   diazepam 2 MG tablet Commonly known as: Valium Take 1 tablet by mouth prior to MRI.        Allergies:  Allergies  Allergen Reactions   Bee Venom    Other Swelling    BEES: Hives, swelling,  Difficulty breathing    Family History: No family history on file.  Social History:  reports that he has never smoked. He has never used smokeless tobacco. He reports current alcohol use. He reports that he does not use drugs.  ROS: All other review of systems were reviewed and are negative except what is noted above in HPI  Physical Exam: There were no vitals taken for this visit.  Constitutional:  Alert and oriented, No acute distress. HEENT: [place] AT, moist mucus membranes.  Trachea midline, no masses. Cardiovascular: No clubbing, cyanosis, or edema. Respiratory: Normal respiratory effort, no increased work of breathing. GI: Abdomen is soft, nontender, nondistended, no abdominal masses GU: No CVA tenderness.  Lymph: No cervical or  inguinal lymphadenopathy. Skin: No rashes, bruises or suspicious lesions. Neurologic: Grossly intact, no focal deficits, moving all 4 extremities. Psychiatric: Normal mood and affect.  Laboratory Data: Lab Results  Component Value Date   WBC 7.1 09/22/2022   HGB 13.5 09/22/2022   HCT 43.0 09/22/2022   MCV 86.7 09/22/2022   PLT 172 09/22/2022    Lab Results  Component Value Date   CREATININE 0.83 09/22/2022    No results found for: "PSA"  No results found for: "TESTOSTERONE"  No results found for: "HGBA1C"  Urinalysis    Component Value Date/Time   COLORURINE YELLOW 09/22/2022 1913   APPEARANCEUR Clear 06/03/2023 0904   LABSPEC 1.024 09/22/2022 1913   PHURINE 6.0 09/22/2022 1913   GLUCOSEU Negative 06/03/2023 0904   HGBUR NEGATIVE 09/22/2022 1913   BILIRUBINUR Negative 06/03/2023 0904   KETONESUR 5 (A) 09/22/2022 1913   PROTEINUR Negative 06/03/2023 0904   PROTEINUR 30 (A) 09/22/2022 1913   NITRITE Negative 06/03/2023 0904   NITRITE NEGATIVE 09/22/2022 1913   LEUKOCYTESUR Negative 06/03/2023 0904   LEUKOCYTESUR NEGATIVE 09/22/2022 1913    Lab Results  Component Value Date   LABMICR Comment 06/03/2023   BACTERIA NONE SEEN 09/22/2022    Pertinent Imaging:  No results found for this or any previous visit.  No results found for this or any previous  visit.  No results found for this or any previous visit.  No results found for this or any previous visit.  No results found for this or any previous visit.  No valid procedures specified. No results found for this or any previous visit.  No results found for this or any previous visit.   Assessment & Plan:    1. Prostate cancer [facility]) I discussed the natural history of intermediate risk prostate cancer with the patient and the various treatment options including active surveillance, RALP, IMRT, brachytherapy, cryotherapy, HIFU and ADT. After discussing the options the patient elects for IMRt.    No  follow-ups on file.  Wilkie Aye, MD  [facility] [place]

## 2023-07-02 ENCOUNTER — Encounter: Payer: Self-pay | Admitting: Urology

## 2023-07-02 NOTE — Patient Instructions (Signed)

## 2023-07-05 NOTE — Telephone Encounter (Signed)
See below

## 2023-07-05 NOTE — Telephone Encounter (Signed)
Patient asking info and status of surgery.  Please advise.  Call:  [phone] (H)

## 2023-07-07 NOTE — Telephone Encounter (Signed)
See surgery referral documentation

## 2023-07-14 DIAGNOSIS — C61 Malignant neoplasm of prostate: Secondary | ICD-10-CM | POA: Diagnosis not present

## 2023-07-29 ENCOUNTER — Other Ambulatory Visit: Payer: Self-pay

## 2023-07-29 ENCOUNTER — Encounter (HOSPITAL_COMMUNITY): Payer: Self-pay

## 2023-08-01 ENCOUNTER — Encounter (HOSPITAL_COMMUNITY)
Admission: RE | Admit: 2023-08-01 | Discharge: 2023-08-01 | Disposition: A | Discharge status: Home / Self Care | Payer: BC Managed Care – PPO | Source: Ambulatory Visit | Attending: Urology | Admitting: Urology

## 2023-08-01 HISTORY — DX: Malignant (primary) neoplasm, unspecified: C80.1

## 2023-08-04 ENCOUNTER — Ambulatory Visit (HOSPITAL_COMMUNITY): Payer: BC Managed Care – PPO | Admitting: Anesthesiology

## 2023-08-04 ENCOUNTER — Ambulatory Visit (HOSPITAL_COMMUNITY): Payer: BC Managed Care – PPO

## 2023-08-04 ENCOUNTER — Encounter (HOSPITAL_COMMUNITY)
Admission: RE | Disposition: A | Discharge status: Home / Self Care | Payer: Self-pay | Source: Home / Self Care | Attending: Urology

## 2023-08-04 ENCOUNTER — Ambulatory Visit (HOSPITAL_COMMUNITY)
Admission: RE | Admit: 2023-08-04 | Discharge: 2023-08-04 | Disposition: A | Discharge status: Home / Self Care | Payer: BC Managed Care – PPO | Attending: Urology | Admitting: Urology

## 2023-08-04 ENCOUNTER — Encounter (HOSPITAL_COMMUNITY): Payer: Self-pay | Admitting: Urology

## 2023-08-04 DIAGNOSIS — C61 Malignant neoplasm of prostate: Secondary | ICD-10-CM

## 2023-08-04 DIAGNOSIS — I10 Essential (primary) hypertension: Secondary | ICD-10-CM | POA: Diagnosis not present

## 2023-08-04 HISTORY — PX: SPACE OAR INSTILLATION: SHX6769

## 2023-08-04 HISTORY — PX: GOLD SEED IMPLANT: SHX6343

## 2023-08-04 SURGERY — INSERTION, GOLD SEEDS
Anesthesia: General | Site: Prostate

## 2023-08-04 MED ORDER — DEXAMETHASONE SODIUM PHOSPHATE 10 MG/ML IJ SOLN
INTRAMUSCULAR | Status: DC | PRN
  Administered 2023-08-04: 5 mg via INTRAVENOUS

## 2023-08-04 MED ORDER — OXYCODONE HCL 5 MG PO TABS: 5.0000 mg | ORAL_TABLET | Freq: Once | ORAL | Status: DC | PRN

## 2023-08-04 MED ORDER — OXYCODONE HCL 5 MG/5ML PO SOLN
5.0000 mg | Freq: Once | ORAL | Status: DC | PRN
Start: 2023-08-04 — End: 2023-08-04

## 2023-08-04 MED ORDER — CHLORHEXIDINE GLUCONATE 0.12 % MT SOLN
15.0000 mL | Freq: Once | OROMUCOSAL | Status: DC
  Filled 2023-08-04: qty 15

## 2023-08-04 MED ORDER — LIDOCAINE HCL (PF) 2 % IJ SOLN
INTRAMUSCULAR | Status: DC | PRN
  Administered 2023-08-04: 60 mg via INTRADERMAL

## 2023-08-04 MED ORDER — ORAL CARE MOUTH RINSE: 15.0000 mL | Freq: Once | OROMUCOSAL | Status: DC

## 2023-08-04 MED ORDER — PROPOFOL 10 MG/ML IV BOLUS
INTRAVENOUS | Status: DC | PRN
  Administered 2023-08-04: 200 mg via INTRAVENOUS

## 2023-08-04 MED ORDER — FENTANYL CITRATE (PF) 100 MCG/2ML IJ SOLN
INTRAMUSCULAR | Status: DC | PRN
Start: 2023-08-04 — End: 2023-08-04
  Administered 2023-08-04: 25 ug via INTRAVENOUS
  Administered 2023-08-04: 50 ug via INTRAVENOUS
  Administered 2023-08-04: 25 ug via INTRAVENOUS

## 2023-08-04 MED ORDER — GLYCOPYRROLATE PF 0.2 MG/ML IJ SOSY
PREFILLED_SYRINGE | INTRAMUSCULAR | Status: AC
Start: 2023-08-04 — End: ?
  Filled 2023-08-04: qty 1

## 2023-08-04 MED ORDER — PROPOFOL 10 MG/ML IV BOLUS
INTRAVENOUS | Status: AC
Start: 2023-08-04 — End: ?
  Filled 2023-08-04: qty 20

## 2023-08-04 MED ORDER — FENTANYL CITRATE (PF) 100 MCG/2ML IJ SOLN
INTRAMUSCULAR | Status: AC
  Filled 2023-08-04: qty 2

## 2023-08-04 MED ORDER — ONDANSETRON HCL 4 MG/2ML IJ SOLN
INTRAMUSCULAR | Status: AC
Start: 2023-08-04 — End: ?
  Filled 2023-08-04: qty 2

## 2023-08-04 MED ORDER — SODIUM CHLORIDE (PF) 0.9 % IJ SOLN
INTRAMUSCULAR | Status: AC
  Filled 2023-08-04: qty 10

## 2023-08-04 MED ORDER — LIDOCAINE HCL (PF) 2 % IJ SOLN
INTRAMUSCULAR | Status: AC
Start: 2023-08-04 — End: ?
  Filled 2023-08-04: qty 5

## 2023-08-04 MED ORDER — LACTATED RINGERS IV SOLN: INTRAVENOUS | Status: DC

## 2023-08-04 MED ORDER — ONDANSETRON HCL 4 MG/2ML IJ SOLN: 4.0000 mg | Freq: Once | INTRAMUSCULAR | Status: DC | PRN

## 2023-08-04 MED ORDER — TRAMADOL HCL 50 MG PO TABS: 50.0000 mg | ORAL_TABLET | Freq: Four times a day (QID) | ORAL | 0 refills | Status: AC | PRN

## 2023-08-04 MED ORDER — SODIUM CHLORIDE (PF) 0.9 % IJ SOLN
INTRAMUSCULAR | Status: DC | PRN
  Administered 2023-08-04: 10 mL

## 2023-08-04 MED ORDER — 0.9 % SODIUM CHLORIDE (POUR BTL) OPTIME
TOPICAL | Status: DC | PRN
  Administered 2023-08-04: 1000 mL

## 2023-08-04 MED ORDER — FENTANYL CITRATE PF 50 MCG/ML IJ SOSY: 25.0000 ug | PREFILLED_SYRINGE | INTRAMUSCULAR | Status: DC | PRN

## 2023-08-04 MED ORDER — CEFAZOLIN SODIUM-DEXTROSE 2-4 GM/100ML-% IV SOLN
2.0000 g | INTRAVENOUS | Status: AC
  Administered 2023-08-04: 2 g via INTRAVENOUS
  Filled 2023-08-04: qty 100

## 2023-08-04 MED ORDER — ONDANSETRON HCL 4 MG/2ML IJ SOLN
INTRAMUSCULAR | Status: DC | PRN
  Administered 2023-08-04: 4 mg via INTRAVENOUS

## 2023-08-04 SURGICAL SUPPLY — 26 items
COVER BACK TABLE 60X90IN (DRAPES) ×2 IMPLANT
COVER TABLE BACK 60X90 (DRAPES) ×2 IMPLANT
DRAPE EENT ADH APERT 31X51 STR (DRAPES) ×2 IMPLANT
DRAPE LEGGINS SURG 28X43 STRL (DRAPES) ×2 IMPLANT
DRAPE U-SHAPE 47X51 STRL (DRAPES) ×2 IMPLANT
DRSG TEGADERM 4X10 (GAUZE/BANDAGES/DRESSINGS) ×2 IMPLANT
GAUZE SPONGE 4X4 12PLY STRL (GAUZE/BANDAGES/DRESSINGS) ×4 IMPLANT
GLOVE BIO SURGEON STRL SZ8 (GLOVE) ×2 IMPLANT
GLOVE BIOGEL PI IND STRL 7.0 (GLOVE) ×4 IMPLANT
GLOVE BIOGEL PI IND STRL 8 (GLOVE) ×2 IMPLANT
GOWN STRL REUS W/TWL LRG LVL3 (GOWN DISPOSABLE) ×2 IMPLANT
IMPL SPACEOAR SYSTEM 10ML (Spacer) ×2 IMPLANT
IMPLANT SPACEOAR SYSTEM 10ML (Spacer) ×1 IMPLANT
KIT TURNOVER CYSTO (KITS) ×2 IMPLANT
MARKER GOLD PRELOAD 1.2X3 (Urological Implant) ×2 IMPLANT
MARKER SKIN DUAL TIP RULER LAB (MISCELLANEOUS) ×2 IMPLANT
NS IRRIG 1000ML POUR BTL (IV SOLUTION) ×2 IMPLANT
PAD ARMBOARD 7.5X6 YLW CONV (MISCELLANEOUS) ×4 IMPLANT
POSITIONER HEAD 8X9X4 ADT (SOFTGOODS) ×2 IMPLANT
SEED GOLD PRELOAD 1.2X3 (Urological Implant) ×1 IMPLANT
SOL PREP POV-IOD 4OZ 10% (MISCELLANEOUS) ×2 IMPLANT
SYR 10ML LL (SYRINGE) ×2 IMPLANT
SYR CONTROL 10ML LL (SYRINGE) ×2 IMPLANT
TOWEL OR 17X26 4PK STRL BLUE (TOWEL DISPOSABLE) ×2 IMPLANT
UNDERPAD 30X36 HEAVY ABSORB (UNDERPADS AND DIAPERS) ×2 IMPLANT
WATER STERILE IRR 500ML POUR (IV SOLUTION) ×2 IMPLANT

## 2023-08-04 NOTE — Op Note (Signed)
 PRE-OPERATIVE DIAGNOSIS:  Adenocarcinoma of the prostate  POST-OPERATIVE DIAGNOSIS:  Same  PROCEDURE: 1. Prostate Ultrasound 2. Placement of fiducial marks 3. Placement of SpaceOAR  SURGEON:  Surgeon(s): Belvie Clara, MD  ANESTHESIA:  General  EBL:  Minimal  DRAINS: none  FINDINGS: 37.4cc prostate on prostate US   INDICATION: [name] is a 68 year old with a history of T1c prostate cancer who is scheduled to undergo IMRT. He wishes to have fiducial markers and SpaceOAR placed prior to IMRT to decrease rectal toxicity.  Description of procedure: After informed consent the patient was brought to the major OR, placed on the table and administered general anesthesia. He was then moved to the modified lithotomy position with his perineum perpendicular to the floor. His perineum and genitalia were then sterilely prepped. An official timeout was then performed. The transrectal ultrasound probe was placed in the rectum and affixed to the stand. He was then sterilely draped.  A transrectal ultrasound of the prostate was performed.  Lidocaine   was not  instilled using ultrasound guidance into the junction of each seminal vesicle of the prostate.  3 Gold markers were placed into the prostate using the standard template and ultrasound guidance.  Accurate placement of the markers was confirmed.  We then proceeded to mix the SpaceOAR using the kit supplied from the manufacturer. Once this was complete we placed a sinal needle into the perirectal fat between the rectum and the prostate. Once this was accomplished we injected 2cc of normal saline to hydrodissect the plain. We then instilled the the SpaceOAR through the spinal needle and noted good distribution in the perirectal fat.   The patient was awakened and taken to recovery room in stable and satisfactory condition. He tolerated procedure well and there were no intraoperative complications.  CONDITION: Stable, extubated, transferred to  PACU  PLAN: The patient is to be discharged home and he will start IMRT in the next 2-3 weeks

## 2023-08-04 NOTE — Transfer of Care (Signed)
 Immediate Anesthesia Transfer of Care Note  Patient: Jose Hoover  Procedure(s) Performed: GOLD SEED IMPLANT (Prostate) SPACE OAR INSTILLATION (Prostate)  Patient Location: PACU  Anesthesia Type:General  Level of Consciousness: awake  Airway & Oxygen Therapy: Patient Spontanous Breathing  Post-op Assessment: Report given to RN and Post -op Vital signs reviewed and stable  Post vital signs: Reviewed and stable  Last Vitals:  Vitals Value Taken Time  BP 148/93 08/04/23 1224  Temp    Pulse 49 08/04/23 1227  Resp 9 08/04/23 1227  SpO2 98 % 08/04/23 1227  Vitals shown include unfiled device data.  Last Pain:  Vitals:   08/04/23 1033  PainSc: 0-Hoover pain         Complications: Hoover notable events documented.

## 2023-08-04 NOTE — Anesthesia Procedure Notes (Addendum)
 Procedure Name: LMA Insertion Date/Time: 08/04/2023 11:54 AM  Performed by: Elaine Delon CROME, CRNAPre-anesthesia Checklist: Patient identified, Emergency Drugs available, Suction available and Patient being monitored Patient Re-evaluated:Patient Re-evaluated prior to induction Oxygen Delivery Method: Circle system utilized Preoxygenation: Pre-oxygenation with 100% oxygen Induction Type: IV induction LMA: LMA inserted LMA Size: 4.0 Number of attempts: 1 Placement Confirmation: positive ETCO2 and breath sounds checked- equal and bilateral Tube secured with: Tape Dental Injury: Teeth and Oropharynx as per pre-operative assessment

## 2023-08-04 NOTE — H&P (Signed)
 HPI: [name] is a 67yo here for fiducial markers and SpaceOAR for prostate cancer. MRi shows PIRADs 5 lesion involving right transition zone. He has 6/12 cores positive for Gleason 3+3=6 prostate cancer. PSA 10.      PMH:     Past Medical History:  Diagnosis Date   Cataract     Elevated PSA            Surgical History:      Past Surgical History:  Procedure Laterality Date   CATARACT EXTRACTION   2019   PROSTATE BIOPSY              Home Medications:  Allergies as of 06/29/2023         Reactions    Bee Venom      Other Swelling    BEES: Hives, swelling,  Difficulty breathing            Medication List           Accurate as of June 29, 2023  2:42 PM. If you have any questions, ask your nurse or doctor.              atorvastatin 10 MG tablet Commonly known as: LIPITOR Take 10 mg by mouth daily.    diazepam  2 MG tablet Commonly known as: Valium  Take 1 tablet by mouth prior to MRI.             Allergies:  Allergies       Allergies  Allergen Reactions   Bee Venom     Other Swelling      BEES: Hives, swelling,  Difficulty breathing        Family History: No family history on file.       Social History:  reports that he has never smoked. He has never used smokeless tobacco. He reports current alcohol use. He reports that he does not use drugs.   ROS: All other review of systems were reviewed and are negative except what is noted above in HPI   Physical Exam: There were no vitals taken for this visit.  Constitutional:  Alert and oriented, No acute distress. HEENT: [place] AT, moist mucus membranes.  Trachea midline, no masses. Cardiovascular: No clubbing, cyanosis, or edema. Respiratory: Normal respiratory effort, no increased work of breathing. GI: Abdomen is soft, nontender, nondistended, no abdominal masses GU: No CVA tenderness.  Lymph: No cervical or inguinal lymphadenopathy. Skin: No rashes, bruises or suspicious  lesions. Neurologic: Grossly intact, no focal deficits, moving all 4 extremities. Psychiatric: Normal mood and affect.   Laboratory Data: Recent Labs       Lab Results  Component Value Date    WBC 7.1 09/22/2022    HGB 13.5 09/22/2022    HCT 43.0 09/22/2022    MCV 86.7 09/22/2022    PLT 172 09/22/2022        Recent Labs       Lab Results  Component Value Date    CREATININE 0.83 09/22/2022        Recent Labs  No results found for: PSA     Recent Labs  No results found for: TESTOSTERONE      Recent Labs  No results found for: HGBA1C     Urinalysis Labs (Brief)          Component Value Date/Time    COLORURINE YELLOW 09/22/2022 1913    APPEARANCEUR Clear 06/03/2023 0904    LABSPEC 1.024 09/22/2022 1913    PHURINE 6.0 09/22/2022 1913  GLUCOSEU Negative 06/03/2023 0904    HGBUR NEGATIVE 09/22/2022 1913    BILIRUBINUR Negative 06/03/2023 0904    KETONESUR 5 (A) 09/22/2022 1913    PROTEINUR Negative 06/03/2023 0904    PROTEINUR 30 (A) 09/22/2022 1913    NITRITE Negative 06/03/2023 0904    NITRITE NEGATIVE 09/22/2022 1913    LEUKOCYTESUR Negative 06/03/2023 0904    LEUKOCYTESUR NEGATIVE 09/22/2022 1913        Recent Labs       Lab Results  Component Value Date    LABMICR Comment 06/03/2023    BACTERIA NONE SEEN 09/22/2022        Pertinent Imaging:   No results found for this or any previous visit.   No results found for this or any previous visit.   No results found for this or any previous visit.   No results found for this or any previous visit.   No results found for this or any previous visit.   No valid procedures specified. No results found for this or any previous visit.   No results found for this or any previous visit.     Assessment & Plan:     1. Prostate cancer [facility]) I discussed the natural history of intermediate risk prostate cancer with the patient and the various treatment options including active surveillance,  RALP, IMRT, brachytherapy, cryotherapy, HIFU and ADT. After discussing the options the patient elects for IMRt. The risks/benefits/alternatives to fiducial markers with SpaceOAR was explained to the patient and he understands and wishes to proceed with surgery

## 2023-08-04 NOTE — Anesthesia Preprocedure Evaluation (Signed)
Anesthesia Evaluation  Patient identified by MRN, date of birth, ID band Patient awake    Reviewed: Allergy & Precautions, H&P , NPO status , Patient's Chart, lab work & pertinent test results, reviewed documented beta blocker date and time   Airway Mallampati: II  TM Distance: >3 FB Neck ROM: full    Dental no notable dental hx.    Pulmonary neg pulmonary ROS   Pulmonary exam normal breath sounds clear to auscultation       Cardiovascular Exercise Tolerance: Good hypertension, negative cardio ROS  Rhythm:regular Rate:Normal     Neuro/Psych negative neurological ROS  negative psych ROS   GI/Hepatic negative GI ROS, Neg liver ROS,,,  Endo/Other  negative endocrine ROS    Renal/GU negative Renal ROS  negative genitourinary   Musculoskeletal   Abdominal   Peds  Hematology negative hematology ROS (+)   Anesthesia Other Findings   Reproductive/Obstetrics negative OB ROS                             Anesthesia Physical Anesthesia Plan  ASA: 2  Anesthesia Plan: General and General LMA   Post-op Pain Management:    Induction:   PONV Risk Score and Plan: Ondansetron  Airway Management Planned:   Additional Equipment:   Intra-op Plan:   Post-operative Plan:   Informed Consent: I have reviewed the patients History and Physical, chart, labs and discussed the procedure including the risks, benefits and alternatives for the proposed anesthesia with the patient or authorized representative who has indicated his/her understanding and acceptance.     Dental Advisory Given  Plan Discussed with: CRNA  Anesthesia Plan Comments:        Anesthesia Quick Evaluation

## 2023-08-05 ENCOUNTER — Encounter (HOSPITAL_COMMUNITY): Payer: Self-pay | Admitting: Urology

## 2023-08-05 NOTE — Anesthesia Postprocedure Evaluation (Signed)
 Anesthesia Post Note  Patient: Jose Hoover  Procedure(s) Performed: GOLD SEED IMPLANT (Prostate) SPACE OAR INSTILLATION (Prostate)  Patient location during evaluation: Phase II Anesthesia Type: General Level of consciousness: awake Pain management: pain level controlled Vital Signs Assessment: post-procedure vital signs reviewed and stable Respiratory status: spontaneous breathing and respiratory function stable Cardiovascular status: blood pressure returned to baseline and stable Postop Assessment: no headache and no apparent nausea or vomiting Anesthetic complications: no Comments: Late entry   No notable events documented.   Last Vitals:  Vitals:   08/04/23 1245 08/04/23 1308  BP: (!) 136/96 (!) 140/91  Pulse: (!) 57 (!) 55  Resp: (!) 9 12  Temp:  36.5 C  SpO2: 92% 96%    Last Pain:  Vitals:   08/04/23 1308  TempSrc: Oral  PainSc: 0-No pain                 Yvonna JINNY Bosworth

## 2023-08-08 DIAGNOSIS — C61 Malignant neoplasm of prostate: Secondary | ICD-10-CM | POA: Diagnosis not present

## 2023-08-08 DIAGNOSIS — F40240 Claustrophobia: Secondary | ICD-10-CM | POA: Diagnosis not present

## 2023-08-11 DIAGNOSIS — C61 Malignant neoplasm of prostate: Secondary | ICD-10-CM | POA: Diagnosis not present

## 2023-08-17 ENCOUNTER — Ambulatory Visit (INDEPENDENT_AMBULATORY_CARE_PROVIDER_SITE_OTHER): Payer: BC Managed Care – PPO | Admitting: Urology

## 2023-08-17 VITALS — BP 147/85 | HR 71

## 2023-08-17 DIAGNOSIS — C61 Malignant neoplasm of prostate: Secondary | ICD-10-CM

## 2023-08-17 NOTE — Progress Notes (Signed)
08/17/2023 1:33 PM   Tyrell Starck Porchia Nov 24, 1955 [id number]  Referring provider: Assunta Found, MD [address] [place],  [place] [address]  Followup prostate cancer   HPI: Jose Hoover is a 68yo here for followup fro prostate cancer. He is doing well after fiducial marker placement and SpaceOAR. No issues with urinating. IPSS 3 QOL 2.    PMH: Past Medical History:  Diagnosis Date   Cancer [facility])    Prostate cancer   Cataract    Elevated PSA     Surgical History: Past Surgical History:  Procedure Laterality Date   CATARACT EXTRACTION  2019   COLONOSCOPY     GOLD SEED IMPLANT N/A 08/04/2023   Procedure: GOLD SEED IMPLANT;  Surgeon: Malen Gauze, MD;  Location: [facility];  Service: Urology;  Laterality: N/A;   PROSTATE BIOPSY     SPACE OAR INSTILLATION N/A 08/04/2023   Procedure: SPACE OAR INSTILLATION;  Surgeon: Malen Gauze, MD;  Location: [facility];  Service: Urology;  Laterality: N/A;    Home Medications:  Allergies as of 08/17/2023       Reactions   Bee Venom    Other Swelling   BEES: Hives, swelling,  Difficulty breathing        Medication List        Accurate as of August 17, 2023  1:33 PM. If you have any questions, ask your nurse or doctor.          atorvastatin 10 MG tablet Commonly known as: LIPITOR Take 10 mg by mouth daily after lunch.   diazepam 2 MG tablet Commonly known as: Valium Take 1 tablet by mouth prior to MRI.   traMADol 50 MG tablet Commonly known as: Ultram Take 1 tablet (50 mg total) by mouth every 6 (six) hours as needed.        Allergies:  Allergies  Allergen Reactions   Bee Venom    Other Swelling    BEES: Hives, swelling,  Difficulty breathing    Family History: No family history on file.  Social History:  reports that he has never smoked. He has never used smokeless tobacco. He reports current alcohol use of about 4.0 standard drinks of alcohol per week. He reports that he does not use  drugs.  ROS: All other review of systems were reviewed and are negative except what is noted above in HPI  Physical Exam: BP (!) 147/85   Pulse 71   Constitutional:  Alert and oriented, No acute distress. HEENT: [place] AT, moist mucus membranes.  Trachea midline, no masses. Cardiovascular: No clubbing, cyanosis, or edema. Respiratory: Normal respiratory effort, no increased work of breathing. GI: Abdomen is soft, nontender, nondistended, no abdominal masses GU: No CVA tenderness.  Lymph: No cervical or inguinal lymphadenopathy. Skin: No rashes, bruises or suspicious lesions. Neurologic: Grossly intact, no focal deficits, moving all 4 extremities. Psychiatric: Normal mood and affect.  Laboratory Data: Lab Results  Component Value Date   WBC 7.1 09/22/2022   HGB 13.5 09/22/2022   HCT 43.0 09/22/2022   MCV 86.7 09/22/2022   PLT 172 09/22/2022    Lab Results  Component Value Date   CREATININE 0.83 09/22/2022    No results found for: "PSA"  No results found for: "TESTOSTERONE"  No results found for: "HGBA1C"  Urinalysis    Component Value Date/Time   COLORURINE YELLOW 09/22/2022 1913   APPEARANCEUR Clear 06/03/2023 0904   LABSPEC 1.024 09/22/2022 1913   PHURINE 6.0 09/22/2022 1913  GLUCOSEU Negative 06/03/2023 0904   HGBUR NEGATIVE 09/22/2022 1913   BILIRUBINUR Negative 06/03/2023 0904   KETONESUR 5 (A) 09/22/2022 1913   PROTEINUR Negative 06/03/2023 0904   PROTEINUR 30 (A) 09/22/2022 1913   NITRITE Negative 06/03/2023 0904   NITRITE NEGATIVE 09/22/2022 1913   LEUKOCYTESUR Negative 06/03/2023 0904   LEUKOCYTESUR NEGATIVE 09/22/2022 1913    Lab Results  Component Value Date   LABMICR Comment 06/03/2023   BACTERIA NONE SEEN 09/22/2022    Pertinent Imaging:  No results found for this or any previous visit.  No results found for this or any previous visit.  No results found for this or any previous visit.  No results found for this or any previous  visit.  No results found for this or any previous visit.  No results found for this or any previous visit.  No results found for this or any previous visit.  No results found for this or any previous visit.   Assessment & Plan:    1. Prostate cancer (HCC) (Primary) Followup 5 months with a PSA - Urinalysis, Routine w reflex microscopic   No follow-ups on file.  Wilkie Aye, MD  [facility] [place]

## 2023-08-23 ENCOUNTER — Encounter: Payer: Self-pay | Admitting: Urology

## 2023-08-23 NOTE — Patient Instructions (Signed)

## 2023-08-24 DIAGNOSIS — C61 Malignant neoplasm of prostate: Secondary | ICD-10-CM | POA: Diagnosis not present

## 2023-08-25 DIAGNOSIS — C61 Malignant neoplasm of prostate: Secondary | ICD-10-CM | POA: Diagnosis not present

## 2023-08-31 DIAGNOSIS — C61 Malignant neoplasm of prostate: Secondary | ICD-10-CM | POA: Diagnosis not present

## 2023-09-02 DIAGNOSIS — C61 Malignant neoplasm of prostate: Secondary | ICD-10-CM | POA: Diagnosis not present

## 2023-09-05 DIAGNOSIS — C61 Malignant neoplasm of prostate: Secondary | ICD-10-CM | POA: Diagnosis not present

## 2023-09-07 DIAGNOSIS — C61 Malignant neoplasm of prostate: Secondary | ICD-10-CM | POA: Diagnosis not present

## 2023-09-09 DIAGNOSIS — C61 Malignant neoplasm of prostate: Secondary | ICD-10-CM | POA: Diagnosis not present

## 2023-09-20 DIAGNOSIS — C61 Malignant neoplasm of prostate: Secondary | ICD-10-CM | POA: Diagnosis not present

## 2023-11-10 DIAGNOSIS — R3915 Urgency of urination: Secondary | ICD-10-CM | POA: Diagnosis not present

## 2023-11-10 DIAGNOSIS — F40240 Claustrophobia: Secondary | ICD-10-CM | POA: Diagnosis not present

## 2023-11-10 DIAGNOSIS — R350 Frequency of micturition: Secondary | ICD-10-CM | POA: Diagnosis not present

## 2023-11-10 DIAGNOSIS — Z923 Personal history of irradiation: Secondary | ICD-10-CM | POA: Diagnosis not present

## 2023-11-10 DIAGNOSIS — R300 Dysuria: Secondary | ICD-10-CM | POA: Diagnosis not present

## 2023-11-10 DIAGNOSIS — C61 Malignant neoplasm of prostate: Secondary | ICD-10-CM | POA: Diagnosis not present

## 2023-12-06 DIAGNOSIS — C61 Malignant neoplasm of prostate: Secondary | ICD-10-CM | POA: Diagnosis not present

## 2023-12-06 DIAGNOSIS — R399 Unspecified symptoms and signs involving the genitourinary system: Secondary | ICD-10-CM | POA: Diagnosis not present

## 2023-12-06 DIAGNOSIS — F40240 Claustrophobia: Secondary | ICD-10-CM | POA: Diagnosis not present

## 2023-12-06 DIAGNOSIS — N3281 Overactive bladder: Secondary | ICD-10-CM | POA: Diagnosis not present

## 2024-01-24 ENCOUNTER — Other Ambulatory Visit: Payer: BC Managed Care – PPO

## 2024-01-24 DIAGNOSIS — C61 Malignant neoplasm of prostate: Secondary | ICD-10-CM | POA: Diagnosis not present

## 2024-01-25 LAB — PSA: Prostate Specific Ag, Serum: 0.9 ng/mL (ref 0.0–4.0)

## 2024-01-27 ENCOUNTER — Ambulatory Visit: Payer: BC Managed Care – PPO | Admitting: Urology

## 2024-01-27 ENCOUNTER — Encounter: Payer: Self-pay | Admitting: Urology

## 2024-01-27 VITALS — BP 163/87 | HR 60

## 2024-01-27 DIAGNOSIS — C61 Malignant neoplasm of prostate: Secondary | ICD-10-CM

## 2024-01-27 LAB — URINALYSIS, ROUTINE W REFLEX MICROSCOPIC
Bilirubin, UA: NEGATIVE
Glucose, UA: NEGATIVE
Ketones, UA: NEGATIVE
Leukocytes,UA: NEGATIVE
Nitrite, UA: NEGATIVE
Protein,UA: NEGATIVE
RBC, UA: NEGATIVE
Specific Gravity, UA: 1.03 (ref 1.005–1.030)
Urobilinogen, Ur: 0.2 mg/dL (ref 0.2–1.0)
pH, UA: 5.5 (ref 5.0–7.5)

## 2024-01-27 NOTE — Patient Instructions (Signed)

## 2024-01-27 NOTE — Progress Notes (Signed)
 01/27/2024 9:46 AM   Elsie SQUIBB Gedney 11/06/1955 [id number]  Referring provider: Marvine Rush, MD [address] [place],  [place] [address]     HPI: Mr Pettway is a [phone] here for followup for prostate cancer. He finished IMRT in Feb 2025. IPSS 9 QOL 3 on no BPH medication. Uirne stream strong. No straining to urinate. Nocturia 2x. PSA 0.9. No dysuria.    PMH: Past Medical History:  Diagnosis Date   Cancer [facility])    Prostate cancer   Cataract    Elevated PSA     Surgical History: Past Surgical History:  Procedure Laterality Date   CATARACT EXTRACTION  2019   COLONOSCOPY     GOLD SEED IMPLANT N/A 08/04/2023   Procedure: GOLD SEED IMPLANT;  Surgeon: Sherrilee Belvie CROME, MD;  Location: [facility];  Service: Urology;  Laterality: N/A;   PROSTATE BIOPSY     SPACE OAR INSTILLATION N/A 08/04/2023   Procedure: SPACE OAR INSTILLATION;  Surgeon: Sherrilee Belvie CROME, MD;  Location: [facility];  Service: Urology;  Laterality: N/A;    Home Medications:  Allergies as of 01/27/2024       Reactions   Bee Venom    Other Swelling   BEES: Hives, swelling,  Difficulty breathing        Medication List        Accurate as of January 27, 2024  9:46 AM. If you have any questions, ask your nurse or doctor.          atorvastatin 10 MG tablet Commonly known as: LIPITOR Take 10 mg by mouth daily after lunch.   diazepam  2 MG tablet Commonly known as: Valium  Take 1 tablet by mouth prior to MRI.   traMADol  50 MG tablet Commonly known as: Ultram  Take 1 tablet (50 mg total) by mouth every 6 (six) hours as needed.        Allergies:  Allergies  Allergen Reactions   Bee Venom    Other Swelling    BEES: Hives, swelling,  Difficulty breathing    Family History: No family history on file.  Social History:  reports that he has never smoked. He has never used smokeless tobacco. He reports current alcohol use of about 4.0 standard drinks of alcohol per week. He reports that he  does not use drugs.  ROS: All other review of systems were reviewed and are negative except what is noted above in HPI  Physical Exam: BP (!) 163/87   Pulse 60   Constitutional:  Alert and oriented, No acute distress. HEENT: [place] AT, moist mucus membranes.  Trachea midline, no masses. Cardiovascular: No clubbing, cyanosis, or edema. Respiratory: Normal respiratory effort, no increased work of breathing. GI: Abdomen is soft, nontender, nondistended, no abdominal masses GU: No CVA tenderness.  Lymph: No cervical or inguinal lymphadenopathy. Skin: No rashes, bruises or suspicious lesions. Neurologic: Grossly intact, no focal deficits, moving all 4 extremities. Psychiatric: Normal mood and affect.  Laboratory Data: Lab Results  Component Value Date   WBC 7.1 09/22/2022   HGB 13.5 09/22/2022   HCT 43.0 09/22/2022   MCV 86.7 09/22/2022   PLT 172 09/22/2022    Lab Results  Component Value Date   CREATININE 0.83 09/22/2022    No results found for: PSA  No results found for: TESTOSTERONE   No results found for: HGBA1C  Urinalysis    Component Value Date/Time   COLORURINE YELLOW 09/22/2022 1913   APPEARANCEUR Clear 06/03/2023 0904   LABSPEC 1.024 09/22/2022 1913  PHURINE 6.0 09/22/2022 1913   GLUCOSEU Negative 06/03/2023 0904   HGBUR NEGATIVE 09/22/2022 1913   BILIRUBINUR Negative 06/03/2023 0904   KETONESUR 5 (A) 09/22/2022 1913   PROTEINUR Negative 06/03/2023 0904   PROTEINUR 30 (A) 09/22/2022 1913   NITRITE Negative 06/03/2023 0904   NITRITE NEGATIVE 09/22/2022 1913   LEUKOCYTESUR Negative 06/03/2023 0904   LEUKOCYTESUR NEGATIVE 09/22/2022 1913    Lab Results  Component Value Date   LABMICR Comment 06/03/2023   BACTERIA NONE SEEN 09/22/2022    Pertinent Imaging:  No results found for this or any previous visit.  No results found for this or any previous visit.  No results found for this or any previous visit.  No results found for this or any  previous visit.  No results found for this or any previous visit.  No results found for this or any previous visit.  No results found for this or any previous visit.  No results found for this or any previous visit.   Assessment & Plan:    1. Prostate cancer (HCC) (Primary) -followup 6 months with a PSA - Urinalysis, Routine w reflex microscopic   No follow-ups on file.  Belvie Clara, MD  [facility] [place]

## 2024-01-31 ENCOUNTER — Ambulatory Visit: Payer: Self-pay | Admitting: Urology

## 2024-03-20 ENCOUNTER — Ambulatory Visit
Admission: RE | Admit: 2024-03-20 | Discharge: 2024-03-20 | Disposition: A | Discharge status: Home / Self Care | Source: Ambulatory Visit | Attending: Nurse Practitioner | Admitting: Nurse Practitioner

## 2024-03-20 VITALS — BP 178/95 | HR 61 | Temp 97.9°F | Resp 18

## 2024-03-20 DIAGNOSIS — R21 Rash and other nonspecific skin eruption: Secondary | ICD-10-CM

## 2024-03-20 MED ORDER — PREDNISONE 20 MG PO TABS: 40.0000 mg | ORAL_TABLET | Freq: Every day | ORAL | 0 refills | Status: AC

## 2024-03-20 MED ORDER — DEXAMETHASONE SODIUM PHOSPHATE 10 MG/ML IJ SOLN
10.0000 mg | INTRAMUSCULAR | Status: AC
  Administered 2024-03-20: 10 mg via INTRAMUSCULAR

## 2024-03-20 MED ORDER — TRIAMCINOLONE ACETONIDE 0.1 % EX CREA: 1.0000 | TOPICAL_CREAM | Freq: Two times a day (BID) | CUTANEOUS | 0 refills | Status: AC

## 2024-03-20 NOTE — Discharge Instructions (Addendum)
 You were given an injection of Decadron  10 mg today. Take medication as prescribed.  You will begin the prednisone  on 03/21/2024. To help with itching, you may take over-the-counter cetirizine, Allegra, or Claritin during the daytime.  You may take Benadryl at bedtime. Avoid hot baths or showers while symptoms persist. Recommend taking lukewarm baths. May apply cool cloths to the area to help with itching or discomfort. Avoid scratching, rubbing, or manipulating the areas while symptoms persist. Recommend over-the-counter Aveeno Colloidal Oatmeal Bath to use to help with drying and itching. Make sure you are wearing protective clothing to include long sleeves, gloves, and long pants. Please follow-up with your primary care physician for further evaluation of your blood pressure.  Go to the emergency department immediately if you develop chest pain, shortness of breath, difficulty breathing, or other concerns. Follow up if symptoms do not improve.

## 2024-03-20 NOTE — ED Provider Notes (Signed)
 [facility]    CSN: [id number] Arrival date & time: 03/20/24  0840      History   Chief Complaint Chief Complaint  Patient presents with   Poison Ivy    Pulled weeds Thursday and got into some poison oak or ivy. - Entered by patient    HPI Jose Hoover is a 68 y.o. male.   The history is provided by the patient.   Patient presents for complaints of poison ivy rash that started over the past several days.  Patient states he was pulling weeds prior to his symptoms starting.  He now has a blistering rash noted to his bilateral hands, right antecubital, and around the left eye.  Patient denies fever, chills, chest pain, difficulty breathing, abdominal pain, nausea, or vomiting.  States he has been using calamine lotion to the affected areas. Past Medical History:  Diagnosis Date   Cancer [facility])    Prostate cancer   Cataract    Elevated PSA     Patient Active Problem List   Diagnosis Date Noted   Malignant neoplasm of prostate (HCC) 05/04/2023    Past Surgical History:  Procedure Laterality Date   CATARACT EXTRACTION  2019   COLONOSCOPY     GOLD SEED IMPLANT N/A 08/04/2023   Procedure: GOLD SEED IMPLANT;  Surgeon: Sherrilee Belvie CROME, MD;  Location: [facility];  Service: Urology;  Laterality: N/A;   PROSTATE BIOPSY     SPACE OAR INSTILLATION N/A 08/04/2023   Procedure: SPACE OAR INSTILLATION;  Surgeon: Sherrilee Belvie CROME, MD;  Location: [facility];  Service: Urology;  Laterality: N/A;       Home Medications    Prior to Admission medications   Medication Sig Start Date End Date Taking? Authorizing Provider  atorvastatin (LIPITOR) 10 MG tablet Take 10 mg by mouth daily after lunch. 01/20/23   [provider]  diazepam  (VALIUM ) 2 MG tablet Take 1 tablet by mouth prior to MRI. 06/07/23   Larocco, Sarah C, FNP  traMADol  (ULTRAM ) 50 MG tablet Take 1 tablet (50 mg total) by mouth every 6 (six) hours as needed. 08/04/23 08/03/24  Sherrilee Belvie CROME, MD     Family History History reviewed. No pertinent family history.  Social History Social History   Tobacco Use   Smoking status: Never   Smokeless tobacco: Never  Vaping Use   Vaping status: Never Used  Substance Use Topics   Alcohol use: Yes    Alcohol/week: 4.0 standard drinks of alcohol    Types: 4 Cans of beer per week   Drug use: Never     Allergies   Bee venom and Other   Review of Systems Review of Systems Per HPI  Physical Exam Triage Vital Signs ED Triage Vitals [03/20/24 0914]  Encounter Vitals Group     BP (!) 193/106     Girls Systolic BP Percentile      Girls Diastolic BP Percentile      Boys Systolic BP Percentile      Boys Diastolic BP Percentile      Pulse Rate 61     Resp 18     Temp 97.9 F (36.6 C)     Temp Source Oral     SpO2 97 %     Weight      Height      Head Circumference      Peak Flow      Pain Score 0     Pain Loc  Pain Education      Exclude from Growth Chart    No data found.  Updated Vital Signs BP (S) (!) 178/95 Comment: recheck  Pulse 61   Temp 97.9 F (36.6 C) (Oral)   Resp 18   SpO2 97%   Visual Acuity Right Eye Distance:   Left Eye Distance:   Bilateral Distance:    Right Eye Near:   Left Eye Near:    Bilateral Near:     Physical Exam Vitals and nursing note reviewed.  Constitutional:      General: He is not in acute distress.    Appearance: Normal appearance.  HENT:     Head: Normocephalic.  Cardiovascular:     Rate and Rhythm: Normal rate and regular rhythm.     Pulses: Normal pulses.     Heart sounds: Normal heart sounds.  Pulmonary:     Effort: Pulmonary effort is normal. No respiratory distress.     Breath sounds: Normal breath sounds. No stridor. No wheezing, rhonchi or rales.  Musculoskeletal:     Cervical back: Normal range of motion.  Skin:    General: Skin is warm and dry.     Findings: Rash present. Rash is macular and papular.     Comments: Blistering maculopapular rash  noted to the bilateral hands, right antecubital, and around the left eye.  The rash is oozing clear fluid.  There is no fluctuance present.  Neurological:     General: No focal deficit present.     Mental Status: He is alert and oriented to person, place, and time.  Psychiatric:        Mood and Affect: Mood normal.        Behavior: Behavior normal.      UC Treatments / Results  Labs (all labs ordered are listed, but only abnormal results are displayed) Labs Reviewed - No data to display  EKG   Radiology No results found.  Procedures Procedures (including critical care time)  Medications Ordered in UC Medications  dexamethasone  (DECADRON ) injection 10 mg (10 mg Intramuscular Given 03/20/24 0931)    Initial Impression / Assessment and Plan / UC Course  I have reviewed the triage vital signs and the nursing notes.  Pertinent labs & imaging results that were available during my care of the patient were reviewed by me and considered in my medical decision making (see chart for details).  Patient was hypertensive on initial triage.  Repeat BP 178/95.  Decadron  10 mg IM administered.  Will start patient on prednisone  40 mg for itching and inflammation and triamcinolone  cream 0.1% for patient to apply topically.  Supportive care recommendations were provided and discussed with the patient to include over-the-counter antihistamines, cool compresses to the affected area, and use of Aveeno colloidal oatmeal bath.  Discussion with patient to follow-up with his PCP regarding his elevated blood pressure.  Patient was also given strict ER follow-up precautions.  Discussed indications with patient regarding follow-up for the rash.  Patient was in agreement with this plan of care and verbalizes understanding.  All questions were answered.  Patient stable for discharge.   Final Clinical Impressions(s) / UC Diagnoses   Final diagnoses:  None   Discharge Instructions   None    ED  Prescriptions   None    PDMP not reviewed this encounter.   Gilmer Etta PARAS, NP 03/20/24 [phone]

## 2024-03-20 NOTE — ED Triage Notes (Signed)
 Pt reports he came in contact with poison oak x 5 days States the areas on his left hand and right arms are tight. Poison ivy is in his left eye as well    Used calamine lotion

## 2024-07-24 ENCOUNTER — Other Ambulatory Visit

## 2024-07-24 DIAGNOSIS — C61 Malignant neoplasm of prostate: Secondary | ICD-10-CM

## 2024-07-25 LAB — PSA: Prostate Specific Ag, Serum: 0.9 ng/mL (ref 0.0–4.0)

## 2024-08-03 ENCOUNTER — Ambulatory Visit: Admitting: Urology

## 2025-01-30 ENCOUNTER — Ambulatory Visit: Admitting: Urology
